# Patient Record
Sex: Female | Born: 1950 | Race: White | Hispanic: No | Smoking: Never smoker
Health system: Southern US, Community
[De-identification: ages and names within clinical notes are randomized; demographics above are authoritative.]

## PROBLEM LIST (undated history)

## (undated) DIAGNOSIS — T884XXA Failed or difficult intubation, initial encounter: Secondary | ICD-10-CM

## (undated) DIAGNOSIS — E785 Hyperlipidemia, unspecified: Secondary | ICD-10-CM

## (undated) DIAGNOSIS — M62838 Other muscle spasm: Secondary | ICD-10-CM

## (undated) DIAGNOSIS — M2550 Pain in unspecified joint: Secondary | ICD-10-CM

## (undated) DIAGNOSIS — M2540 Effusion, unspecified joint: Secondary | ICD-10-CM

## (undated) DIAGNOSIS — T8859XA Other complications of anesthesia, initial encounter: Secondary | ICD-10-CM

## (undated) DIAGNOSIS — E119 Type 2 diabetes mellitus without complications: Secondary | ICD-10-CM

## (undated) DIAGNOSIS — F32A Depression, unspecified: Secondary | ICD-10-CM

## (undated) DIAGNOSIS — C801 Malignant (primary) neoplasm, unspecified: Secondary | ICD-10-CM

## (undated) DIAGNOSIS — Z8709 Personal history of other diseases of the respiratory system: Secondary | ICD-10-CM

## (undated) DIAGNOSIS — R350 Frequency of micturition: Secondary | ICD-10-CM

## (undated) DIAGNOSIS — M1990 Unspecified osteoarthritis, unspecified site: Secondary | ICD-10-CM

## (undated) DIAGNOSIS — T4145XA Adverse effect of unspecified anesthetic, initial encounter: Secondary | ICD-10-CM

## (undated) DIAGNOSIS — T7840XA Allergy, unspecified, initial encounter: Secondary | ICD-10-CM

## (undated) DIAGNOSIS — K5790 Diverticulosis of intestine, part unspecified, without perforation or abscess without bleeding: Secondary | ICD-10-CM

## (undated) DIAGNOSIS — J189 Pneumonia, unspecified organism: Secondary | ICD-10-CM

## (undated) DIAGNOSIS — I10 Essential (primary) hypertension: Secondary | ICD-10-CM

## (undated) DIAGNOSIS — F329 Major depressive disorder, single episode, unspecified: Secondary | ICD-10-CM

## (undated) HISTORY — PX: COLONOSCOPY: SHX174

## (undated) HISTORY — PX: OTHER SURGICAL HISTORY: SHX169

## (undated) HISTORY — PX: CERVICAL CONE BIOPSY: SUR198

## (undated) HISTORY — PX: MYRINGOTOMY: SUR874

---

## 2005-05-19 ENCOUNTER — Ambulatory Visit (HOSPITAL_COMMUNITY): Admission: RE | Admit: 2005-05-19 | Discharge: 2005-05-19 | Payer: Self-pay | Admitting: *Deleted

## 2005-06-05 ENCOUNTER — Ambulatory Visit (HOSPITAL_COMMUNITY): Admission: RE | Admit: 2005-06-05 | Discharge: 2005-06-05 | Payer: Self-pay | Admitting: *Deleted

## 2005-06-05 ENCOUNTER — Encounter (INDEPENDENT_AMBULATORY_CARE_PROVIDER_SITE_OTHER): Payer: Self-pay | Admitting: *Deleted

## 2005-10-02 ENCOUNTER — Encounter: Admission: RE | Admit: 2005-10-02 | Discharge: 2005-10-02 | Payer: Self-pay | Admitting: *Deleted

## 2006-11-04 ENCOUNTER — Encounter: Admission: RE | Admit: 2006-11-04 | Discharge: 2006-11-04 | Payer: Self-pay | Admitting: Internal Medicine

## 2007-07-26 IMAGING — CR DG CHEST 2V
2 series · 2 of 2 positions shown · non-contrast
Comparison: None.

CLINICAL DATA: GYN adenocarcinoma.  Pre-op respiratory exam.
 CHEST - 2 VIEW:

[view not recorded (1 of 2)]
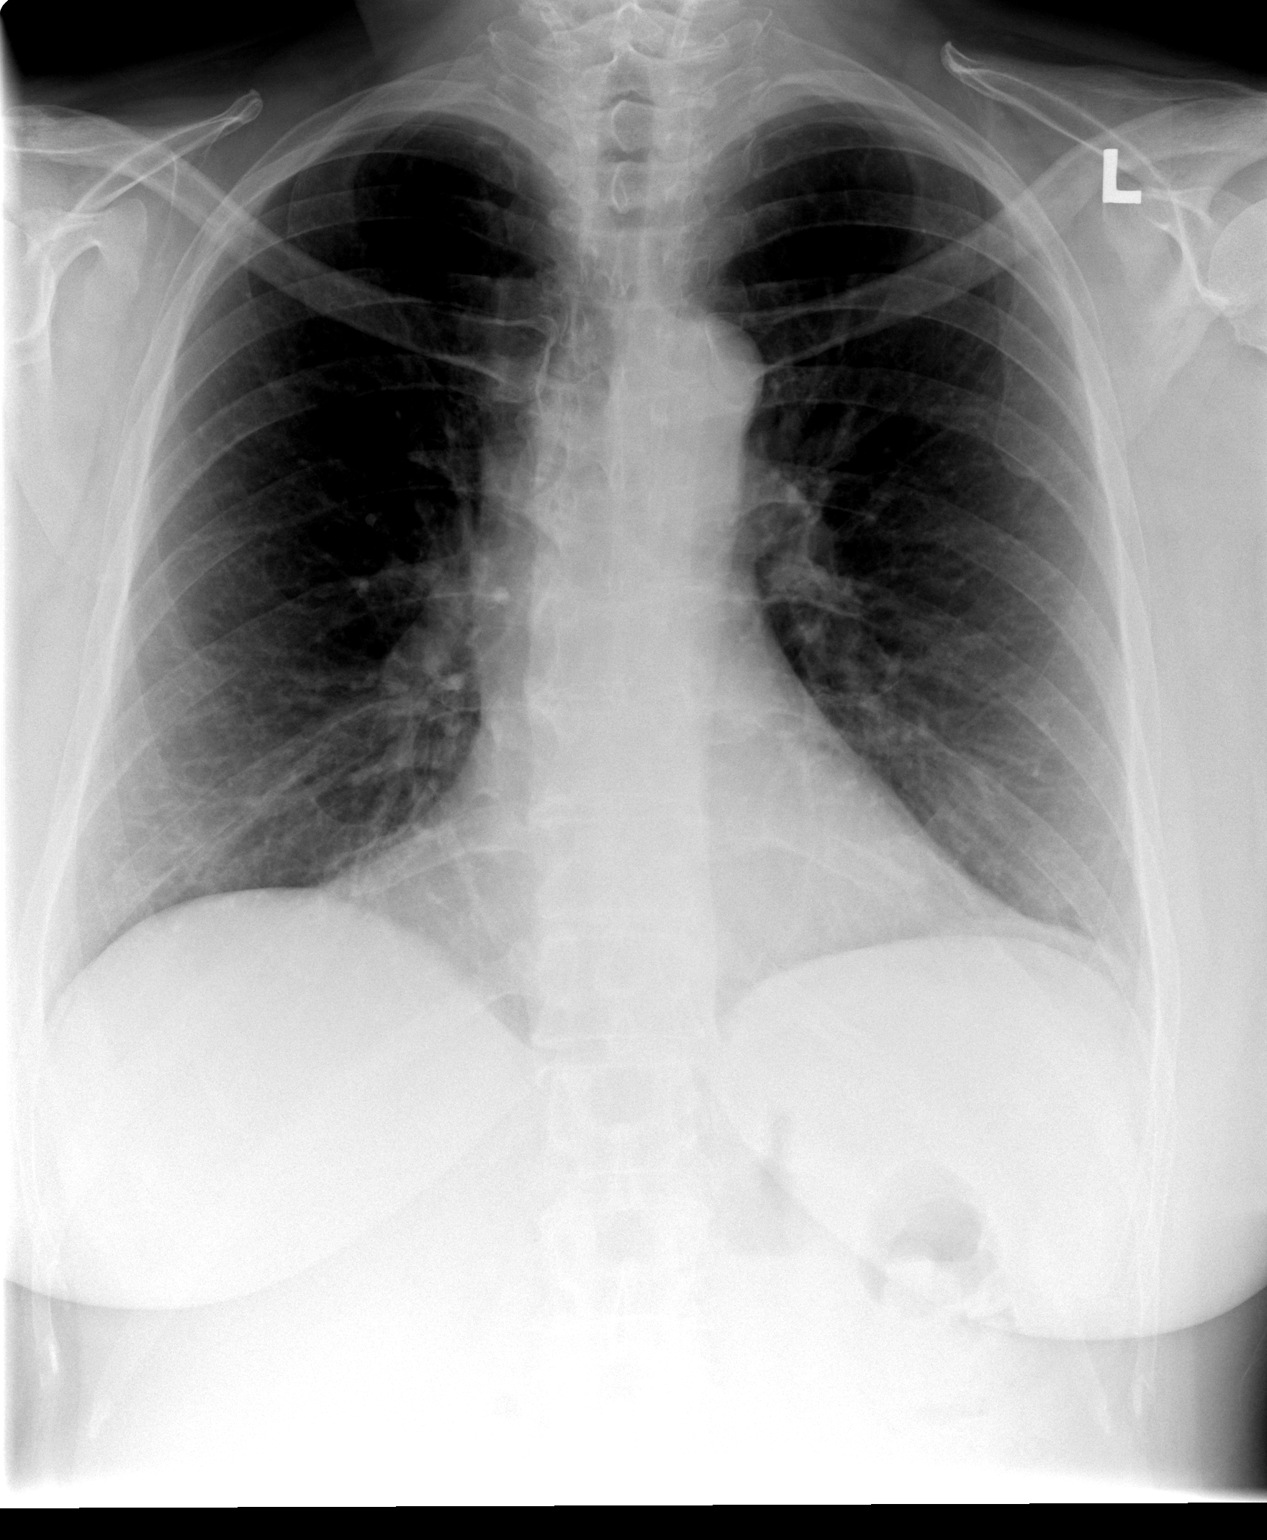

[view not recorded (2 of 2)]
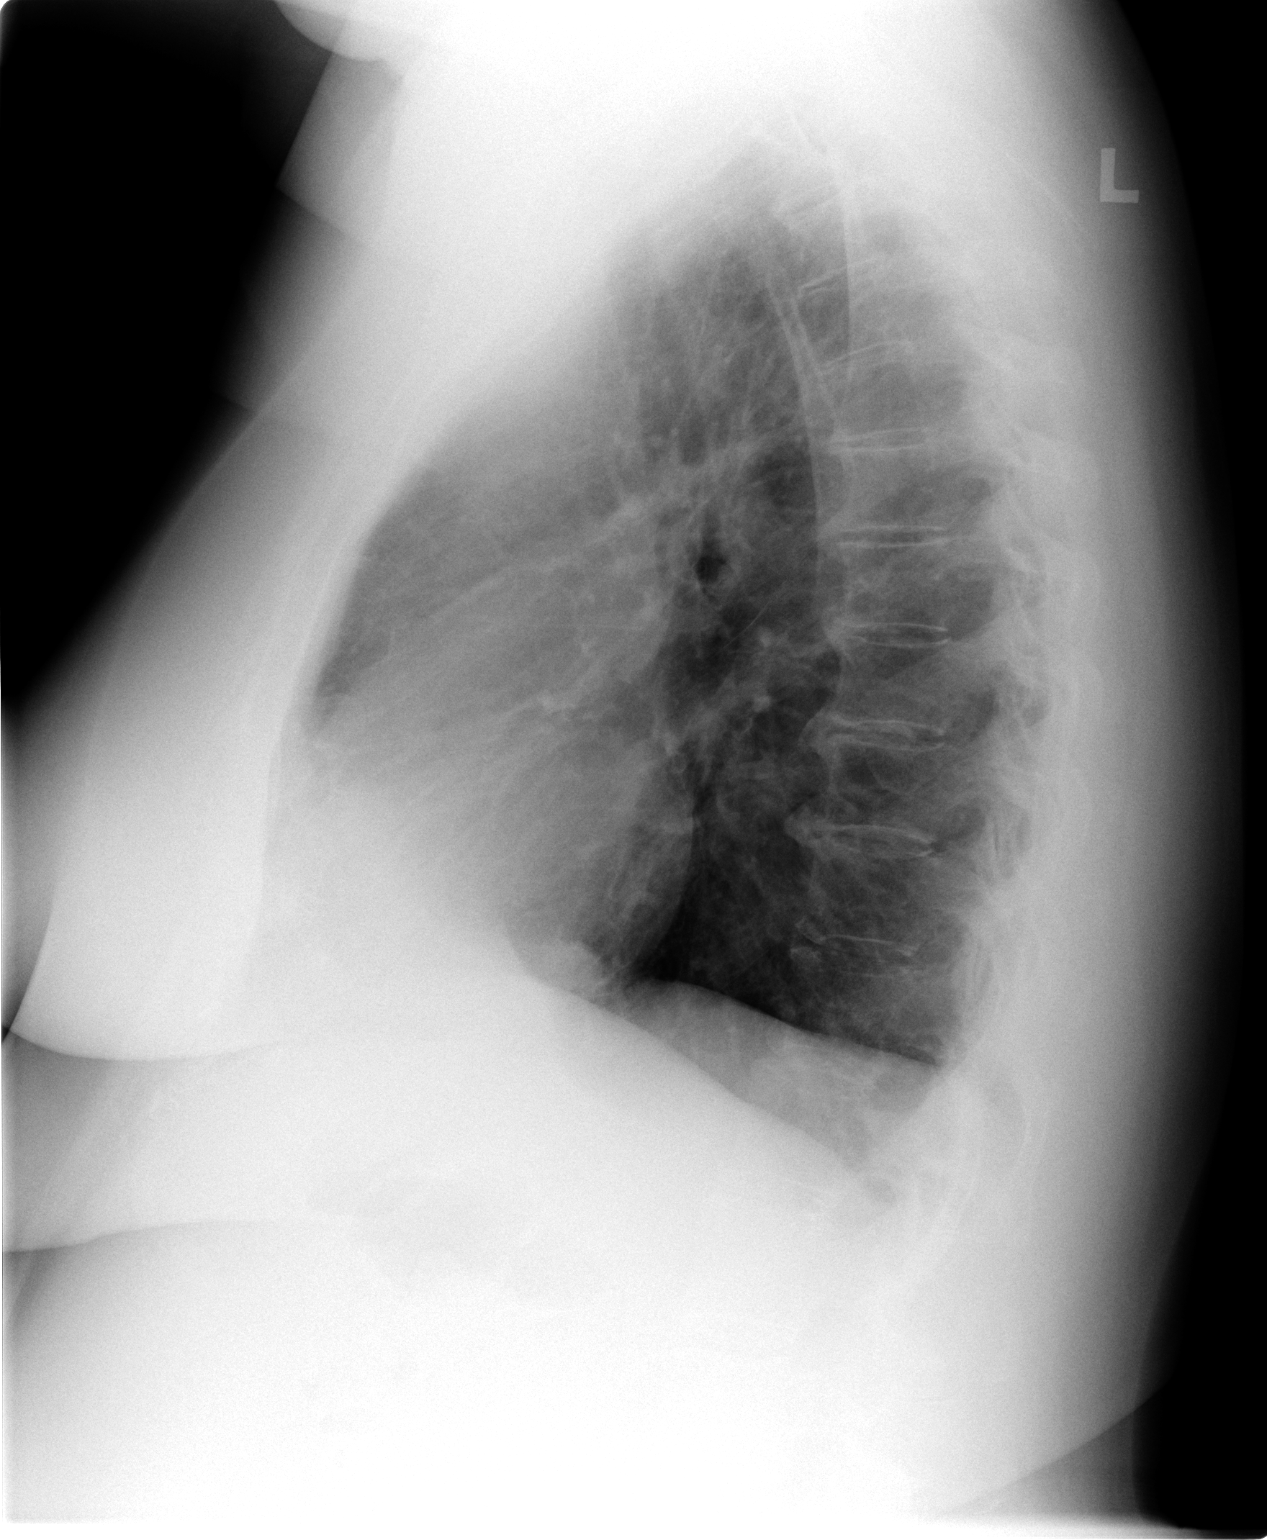

[2 of 2 positions shown; findings below may reference images not displayed]

FINDINGS: Heart size is normal.  Both lungs are clear.  There is no evidence of pleural effusion.  No mass or adenopathy identified.  Prominent epicardial fat pad is noted.  Mild thoracic spine degenerative changes are seen.
IMPRESSION: No active cardiopulmonary disease.

## 2007-11-10 ENCOUNTER — Encounter: Admission: RE | Admit: 2007-11-10 | Discharge: 2007-11-10 | Payer: Self-pay | Admitting: Internal Medicine

## 2008-11-16 ENCOUNTER — Encounter: Admission: RE | Admit: 2008-11-16 | Discharge: 2008-11-16 | Payer: Self-pay | Admitting: Internal Medicine

## 2010-01-16 ENCOUNTER — Encounter
Admission: RE | Admit: 2010-01-16 | Discharge: 2010-01-16 | Payer: Self-pay | Source: Home / Self Care | Attending: Internal Medicine | Admitting: Internal Medicine

## 2010-06-13 NOTE — Op Note (Signed)
NAMEYALISSA, FINK              ACCOUNT NO.:  [id number]   MEDICAL RECORD NO.:  [id number]          PATIENT TYPE:  AMB   LOCATION:  SDC                           FACILITY:  [facility]   PHYSICIAN:  Pershing Cox, M.D.DATE OF BIRTH:  [birth year]   DATE OF PROCEDURE:  06/05/2005  DATE OF DISCHARGE:                                 OPERATIVE REPORT   PREOPERATIVE DIAGNOSIS:  Adenocarcinoma of the cervix.   POSTOPERATIVE DIAGNOSES:  1.  Adenocarcinoma of the cervix.  2.  Vaginal atrophy.   PROCEDURES:  1.  Examination under anesthesia.  2.  Cold knife cone biopsy.   SURGEON:  Pershing Cox, M.D.   ASSISTANT:  None.   ANESTHESIA:  General by LMA and Vasopressin paracervical block.   SPECIMENS:  1.  Cone biopsy of the cervix  2.  Endocervical curettage.   INDICATION FOR PROCEDURE:  The patient is 60 years of age.  She recently had  establishment of new care in our office and had a Pap smear performed, which  was abnormal.  This was read as glandular cell atypia, and her _HC2 assay  was positive.  Colposcopy was performed with no evidence of exocervical  disease.  Endocervical curettage showed adenocarcinoma in situ, and the  patient was counseled for a cone biopsy.  At her original preop exam she was  found to have a history of diabetes and had been off her medications.  Her  blood glucose was over 300.  For that reason we postponed her surgery and  she was seen by a new physician, Dr. Sherryll Burger, who has been managing her  diabetes, and her fingersticks have been under good control.  She is brought  to the operating room today for a cold knife conization to rule out invasive  adenocarcinoma of the endocervix.   OPERATIVE FINDINGS:  The patient had significant vaginal atrophy.  The  cervix was actually very small and all of the vaginal mucosa was constricted  due to the significant atrophy.  The cervix was 4 cm in length on sounding  of the endocervix and the endometrial canal  sounded to 8 cm in depth.   PROCEDURE:  Erykah Lippert was brought to the operating room with an IV in  place.  She received vancomycin as a preoperative antibiotic and thigh-high  PAS stockings were placed on the lower extremities.  She was placed supine  on the OR table and IV sedation was administered.  A cobra LMA was placed in  order to obtain general anesthesia.  She was then placed into Allen stirrups  and prepped with Betadine for a sterile vaginal procedure.  Sterile linens  were used to drape her for this procedure.  Bivalve speculum was inserted  into the vagina and anterior cervix was grasped with a single-tooth  tenaculum.  Using the Vasopressin solution, 20 units in 100 mL, 25 mL were  injected into the paracervical tissues to establish hemostasis.  Vicryl 0  sutures were placed along the exocervix, first superficially and then very  deep.  These were to control descending branches of the uterine  artery  during conization.  Because no lesion had ever been seen on the exocervix  with colposcopy, it was not necessary to use Lugol's or to use acetic acid  staining.  With the stitches using to retract the cervix, a #11 blade was  used to do the initial circle around the exocervix.  This approached the  stitch but did not cross the stitches.  Using Metzenbaum scissors, the  cervical tissue was dissected down into the tip of the cone and the cone was  removed from its base.  Endocervical curettings were then obtained onto a  Telfa and the cone was placed into saline to be taken to the pathologist  after the procedure.   Initially cautery set on 60 units was used to obtain hemostasis.  I used a  ball to make this happen.  I observed after the cautery had been used and  there were still areas of small oozing.  At this point Monsel's solution was  placed deep in the cone and along the exocervix.  Despite this the patient  continued to have a steady ooze from several sites.  A  decision was made to  place Sturmdorf sutures, and these were placed anterior and posteriorly.  Surgicel was inserted into the cone before tying the Sturmdorf sutures.  These were placed with 0 Vicryl absorbable.  These sutures were tied down  and the cervix was observed, and there was no evidence of continued  bleeding.  The patient was taken to the recovery room in good condition.      Pershing Cox, M.D.  Electronically Signed     MAJ/MEDQ  D:  06/05/2005  T:  06/06/2005  Job:  [id number]   cc:   Kirstie Peri, MD  Fax: [phone]

## 2010-09-22 ENCOUNTER — Other Ambulatory Visit: Payer: Self-pay | Admitting: Obstetrics & Gynecology

## 2010-09-22 DIAGNOSIS — Z1231 Encounter for screening mammogram for malignant neoplasm of breast: Secondary | ICD-10-CM

## 2010-11-03 ENCOUNTER — Ambulatory Visit
Admission: RE | Admit: 2010-11-03 | Discharge: 2010-11-03 | Disposition: A | Discharge status: Home / Self Care | Payer: Self-pay | Source: Ambulatory Visit | Attending: Obstetrics & Gynecology | Admitting: Obstetrics & Gynecology

## 2010-11-03 DIAGNOSIS — Z1231 Encounter for screening mammogram for malignant neoplasm of breast: Secondary | ICD-10-CM

## 2011-10-08 ENCOUNTER — Other Ambulatory Visit: Payer: Self-pay | Admitting: Internal Medicine

## 2011-10-08 DIAGNOSIS — Z1231 Encounter for screening mammogram for malignant neoplasm of breast: Secondary | ICD-10-CM

## 2011-11-09 ENCOUNTER — Ambulatory Visit
Admission: RE | Admit: 2011-11-09 | Discharge: 2011-11-09 | Disposition: A | Discharge status: Home / Self Care | Payer: Self-pay | Source: Ambulatory Visit | Attending: Internal Medicine | Admitting: Internal Medicine

## 2011-11-09 DIAGNOSIS — Z1231 Encounter for screening mammogram for malignant neoplasm of breast: Secondary | ICD-10-CM

## 2012-10-04 ENCOUNTER — Other Ambulatory Visit: Payer: Self-pay

## 2012-10-04 DIAGNOSIS — Z1231 Encounter for screening mammogram for malignant neoplasm of breast: Secondary | ICD-10-CM

## 2012-11-09 ENCOUNTER — Ambulatory Visit
Admission: RE | Admit: 2012-11-09 | Discharge: 2012-11-09 | Disposition: A | Discharge status: Home / Self Care | Payer: Self-pay | Source: Ambulatory Visit

## 2012-11-09 DIAGNOSIS — Z1231 Encounter for screening mammogram for malignant neoplasm of breast: Secondary | ICD-10-CM

## 2013-10-03 ENCOUNTER — Other Ambulatory Visit: Payer: Self-pay

## 2013-10-03 DIAGNOSIS — Z1231 Encounter for screening mammogram for malignant neoplasm of breast: Secondary | ICD-10-CM

## 2013-10-31 ENCOUNTER — Ambulatory Visit: Payer: Self-pay

## 2013-11-01 ENCOUNTER — Ambulatory Visit
Admission: RE | Admit: 2013-11-01 | Discharge: 2013-11-01 | Disposition: A | Discharge status: Home / Self Care | Payer: No Typology Code available for payment source | Source: Ambulatory Visit

## 2013-11-01 DIAGNOSIS — Z1231 Encounter for screening mammogram for malignant neoplasm of breast: Secondary | ICD-10-CM

## 2013-12-01 ENCOUNTER — Ambulatory Visit (HOSPITAL_COMMUNITY)
Admission: RE | Admit: 2013-12-01 | Discharge: 2013-12-01 | Disposition: A | Discharge status: Home / Self Care | Payer: Self-pay | Source: Ambulatory Visit | Attending: Surgery | Admitting: Surgery

## 2013-12-01 ENCOUNTER — Other Ambulatory Visit (HOSPITAL_COMMUNITY): Payer: Self-pay | Admitting: Internal Medicine

## 2013-12-01 DIAGNOSIS — H349 Unspecified retinal vascular occlusion: Secondary | ICD-10-CM

## 2014-01-26 DIAGNOSIS — Z8709 Personal history of other diseases of the respiratory system: Secondary | ICD-10-CM

## 2014-01-26 HISTORY — DX: Personal history of other diseases of the respiratory system: Z87.09

## 2014-06-27 DIAGNOSIS — Z0000 Encounter for general adult medical examination without abnormal findings: Secondary | ICD-10-CM | POA: Diagnosis not present

## 2014-06-27 DIAGNOSIS — I10 Essential (primary) hypertension: Secondary | ICD-10-CM | POA: Diagnosis not present

## 2014-06-27 DIAGNOSIS — E1139 Type 2 diabetes mellitus with other diabetic ophthalmic complication: Secondary | ICD-10-CM | POA: Diagnosis not present

## 2014-08-28 DIAGNOSIS — H35371 Puckering of macula, right eye: Secondary | ICD-10-CM | POA: Diagnosis not present

## 2014-08-28 DIAGNOSIS — E11339 Type 2 diabetes mellitus with moderate nonproliferative diabetic retinopathy without macular edema: Secondary | ICD-10-CM | POA: Diagnosis not present

## 2014-10-02 ENCOUNTER — Other Ambulatory Visit: Payer: Self-pay

## 2014-10-02 DIAGNOSIS — Z1231 Encounter for screening mammogram for malignant neoplasm of breast: Secondary | ICD-10-CM

## 2014-11-20 ENCOUNTER — Ambulatory Visit: Payer: Self-pay

## 2014-11-22 ENCOUNTER — Ambulatory Visit: Payer: Self-pay

## 2014-12-12 DIAGNOSIS — R32 Unspecified urinary incontinence: Secondary | ICD-10-CM | POA: Diagnosis not present

## 2014-12-12 DIAGNOSIS — J302 Other seasonal allergic rhinitis: Secondary | ICD-10-CM | POA: Diagnosis not present

## 2014-12-12 DIAGNOSIS — E785 Hyperlipidemia, unspecified: Secondary | ICD-10-CM | POA: Diagnosis not present

## 2014-12-12 DIAGNOSIS — Z23 Encounter for immunization: Secondary | ICD-10-CM | POA: Diagnosis not present

## 2014-12-12 DIAGNOSIS — E669 Obesity, unspecified: Secondary | ICD-10-CM | POA: Diagnosis not present

## 2014-12-12 DIAGNOSIS — F329 Major depressive disorder, single episode, unspecified: Secondary | ICD-10-CM | POA: Diagnosis not present

## 2014-12-12 DIAGNOSIS — E11319 Type 2 diabetes mellitus with unspecified diabetic retinopathy without macular edema: Secondary | ICD-10-CM | POA: Diagnosis not present

## 2014-12-12 DIAGNOSIS — Z6833 Body mass index (BMI) 33.0-33.9, adult: Secondary | ICD-10-CM | POA: Diagnosis not present

## 2014-12-12 DIAGNOSIS — Z0000 Encounter for general adult medical examination without abnormal findings: Secondary | ICD-10-CM | POA: Diagnosis not present

## 2014-12-12 DIAGNOSIS — Z1389 Encounter for screening for other disorder: Secondary | ICD-10-CM | POA: Diagnosis not present

## 2014-12-12 DIAGNOSIS — E1139 Type 2 diabetes mellitus with other diabetic ophthalmic complication: Secondary | ICD-10-CM | POA: Diagnosis not present

## 2014-12-12 DIAGNOSIS — I10 Essential (primary) hypertension: Secondary | ICD-10-CM | POA: Diagnosis not present

## 2014-12-28 ENCOUNTER — Ambulatory Visit
Admission: RE | Admit: 2014-12-28 | Discharge: 2014-12-28 | Disposition: A | Discharge status: Home / Self Care | Payer: Medicare Other | Source: Ambulatory Visit

## 2014-12-28 DIAGNOSIS — Z1231 Encounter for screening mammogram for malignant neoplasm of breast: Secondary | ICD-10-CM | POA: Diagnosis not present

## 2015-04-15 DIAGNOSIS — E784 Other hyperlipidemia: Secondary | ICD-10-CM | POA: Diagnosis not present

## 2015-04-15 DIAGNOSIS — Z1389 Encounter for screening for other disorder: Secondary | ICD-10-CM | POA: Diagnosis not present

## 2015-04-15 DIAGNOSIS — Z6833 Body mass index (BMI) 33.0-33.9, adult: Secondary | ICD-10-CM | POA: Diagnosis not present

## 2015-04-15 DIAGNOSIS — E1139 Type 2 diabetes mellitus with other diabetic ophthalmic complication: Secondary | ICD-10-CM | POA: Diagnosis not present

## 2015-04-15 DIAGNOSIS — I10 Essential (primary) hypertension: Secondary | ICD-10-CM | POA: Diagnosis not present

## 2015-04-25 DIAGNOSIS — E113293 Type 2 diabetes mellitus with mild nonproliferative diabetic retinopathy without macular edema, bilateral: Secondary | ICD-10-CM | POA: Diagnosis not present

## 2015-06-10 DIAGNOSIS — H5203 Hypermetropia, bilateral: Secondary | ICD-10-CM | POA: Diagnosis not present

## 2015-06-10 DIAGNOSIS — H2513 Age-related nuclear cataract, bilateral: Secondary | ICD-10-CM | POA: Diagnosis not present

## 2015-06-10 DIAGNOSIS — E113293 Type 2 diabetes mellitus with mild nonproliferative diabetic retinopathy without macular edema, bilateral: Secondary | ICD-10-CM | POA: Diagnosis not present

## 2015-09-16 DIAGNOSIS — E784 Other hyperlipidemia: Secondary | ICD-10-CM | POA: Diagnosis not present

## 2015-09-16 DIAGNOSIS — F329 Major depressive disorder, single episode, unspecified: Secondary | ICD-10-CM | POA: Diagnosis not present

## 2015-09-16 DIAGNOSIS — J302 Other seasonal allergic rhinitis: Secondary | ICD-10-CM | POA: Diagnosis not present

## 2015-09-16 DIAGNOSIS — E669 Obesity, unspecified: Secondary | ICD-10-CM | POA: Diagnosis not present

## 2015-09-16 DIAGNOSIS — Z1389 Encounter for screening for other disorder: Secondary | ICD-10-CM | POA: Diagnosis not present

## 2015-09-16 DIAGNOSIS — I10 Essential (primary) hypertension: Secondary | ICD-10-CM | POA: Diagnosis not present

## 2015-09-16 DIAGNOSIS — E11319 Type 2 diabetes mellitus with unspecified diabetic retinopathy without macular edema: Secondary | ICD-10-CM | POA: Diagnosis not present

## 2015-09-16 DIAGNOSIS — E1139 Type 2 diabetes mellitus with other diabetic ophthalmic complication: Secondary | ICD-10-CM | POA: Diagnosis not present

## 2015-09-16 DIAGNOSIS — Z6832 Body mass index (BMI) 32.0-32.9, adult: Secondary | ICD-10-CM | POA: Diagnosis not present

## 2015-11-04 DIAGNOSIS — Z6832 Body mass index (BMI) 32.0-32.9, adult: Secondary | ICD-10-CM | POA: Diagnosis not present

## 2015-11-04 DIAGNOSIS — M25511 Pain in right shoulder: Secondary | ICD-10-CM | POA: Diagnosis not present

## 2015-11-04 DIAGNOSIS — W101XXA Fall (on)(from) sidewalk curb, initial encounter: Secondary | ICD-10-CM | POA: Diagnosis not present

## 2015-11-27 DIAGNOSIS — M7501 Adhesive capsulitis of right shoulder: Secondary | ICD-10-CM | POA: Diagnosis not present

## 2015-11-27 DIAGNOSIS — R531 Weakness: Secondary | ICD-10-CM | POA: Diagnosis not present

## 2015-11-27 DIAGNOSIS — M25512 Pain in left shoulder: Secondary | ICD-10-CM | POA: Diagnosis not present

## 2015-11-27 DIAGNOSIS — M25511 Pain in right shoulder: Secondary | ICD-10-CM | POA: Diagnosis not present

## 2015-12-04 DIAGNOSIS — N390 Urinary tract infection, site not specified: Secondary | ICD-10-CM | POA: Diagnosis not present

## 2015-12-04 DIAGNOSIS — E1139 Type 2 diabetes mellitus with other diabetic ophthalmic complication: Secondary | ICD-10-CM | POA: Diagnosis not present

## 2015-12-04 DIAGNOSIS — I10 Essential (primary) hypertension: Secondary | ICD-10-CM | POA: Diagnosis not present

## 2015-12-04 DIAGNOSIS — R8299 Other abnormal findings in urine: Secondary | ICD-10-CM | POA: Diagnosis not present

## 2015-12-04 DIAGNOSIS — E784 Other hyperlipidemia: Secondary | ICD-10-CM | POA: Diagnosis not present

## 2015-12-09 DIAGNOSIS — I10 Essential (primary) hypertension: Secondary | ICD-10-CM | POA: Diagnosis not present

## 2015-12-09 DIAGNOSIS — M25511 Pain in right shoulder: Secondary | ICD-10-CM | POA: Diagnosis not present

## 2015-12-09 DIAGNOSIS — E1139 Type 2 diabetes mellitus with other diabetic ophthalmic complication: Secondary | ICD-10-CM | POA: Diagnosis not present

## 2015-12-09 DIAGNOSIS — F329 Major depressive disorder, single episode, unspecified: Secondary | ICD-10-CM | POA: Diagnosis not present

## 2015-12-09 DIAGNOSIS — E669 Obesity, unspecified: Secondary | ICD-10-CM | POA: Diagnosis not present

## 2015-12-09 DIAGNOSIS — Z6832 Body mass index (BMI) 32.0-32.9, adult: Secondary | ICD-10-CM | POA: Diagnosis not present

## 2015-12-09 DIAGNOSIS — E784 Other hyperlipidemia: Secondary | ICD-10-CM | POA: Diagnosis not present

## 2015-12-09 DIAGNOSIS — E11319 Type 2 diabetes mellitus with unspecified diabetic retinopathy without macular edema: Secondary | ICD-10-CM | POA: Diagnosis not present

## 2015-12-13 DIAGNOSIS — R531 Weakness: Secondary | ICD-10-CM | POA: Diagnosis not present

## 2015-12-13 DIAGNOSIS — M25511 Pain in right shoulder: Secondary | ICD-10-CM | POA: Diagnosis not present

## 2015-12-13 DIAGNOSIS — M7501 Adhesive capsulitis of right shoulder: Secondary | ICD-10-CM | POA: Diagnosis not present

## 2015-12-13 DIAGNOSIS — M25512 Pain in left shoulder: Secondary | ICD-10-CM | POA: Diagnosis not present

## 2015-12-18 DIAGNOSIS — M25511 Pain in right shoulder: Secondary | ICD-10-CM | POA: Diagnosis not present

## 2015-12-18 DIAGNOSIS — R531 Weakness: Secondary | ICD-10-CM | POA: Diagnosis not present

## 2015-12-18 DIAGNOSIS — M7501 Adhesive capsulitis of right shoulder: Secondary | ICD-10-CM | POA: Diagnosis not present

## 2015-12-18 DIAGNOSIS — M25512 Pain in left shoulder: Secondary | ICD-10-CM | POA: Diagnosis not present

## 2015-12-24 DIAGNOSIS — R531 Weakness: Secondary | ICD-10-CM | POA: Diagnosis not present

## 2015-12-24 DIAGNOSIS — M25512 Pain in left shoulder: Secondary | ICD-10-CM | POA: Diagnosis not present

## 2015-12-24 DIAGNOSIS — M7501 Adhesive capsulitis of right shoulder: Secondary | ICD-10-CM | POA: Diagnosis not present

## 2015-12-24 DIAGNOSIS — M25511 Pain in right shoulder: Secondary | ICD-10-CM | POA: Diagnosis not present

## 2016-01-02 DIAGNOSIS — M25511 Pain in right shoulder: Secondary | ICD-10-CM | POA: Diagnosis not present

## 2016-01-02 DIAGNOSIS — M7501 Adhesive capsulitis of right shoulder: Secondary | ICD-10-CM | POA: Diagnosis not present

## 2016-01-02 DIAGNOSIS — R531 Weakness: Secondary | ICD-10-CM | POA: Diagnosis not present

## 2016-01-02 DIAGNOSIS — M25512 Pain in left shoulder: Secondary | ICD-10-CM | POA: Diagnosis not present

## 2016-01-13 DIAGNOSIS — R531 Weakness: Secondary | ICD-10-CM | POA: Diagnosis not present

## 2016-01-13 DIAGNOSIS — M25511 Pain in right shoulder: Secondary | ICD-10-CM | POA: Diagnosis not present

## 2016-01-13 DIAGNOSIS — M25512 Pain in left shoulder: Secondary | ICD-10-CM | POA: Diagnosis not present

## 2016-01-13 DIAGNOSIS — M7501 Adhesive capsulitis of right shoulder: Secondary | ICD-10-CM | POA: Diagnosis not present

## 2016-01-22 DIAGNOSIS — M7501 Adhesive capsulitis of right shoulder: Secondary | ICD-10-CM | POA: Diagnosis not present

## 2016-01-22 DIAGNOSIS — M25511 Pain in right shoulder: Secondary | ICD-10-CM | POA: Diagnosis not present

## 2016-01-22 DIAGNOSIS — R531 Weakness: Secondary | ICD-10-CM | POA: Diagnosis not present

## 2016-01-22 DIAGNOSIS — M25512 Pain in left shoulder: Secondary | ICD-10-CM | POA: Diagnosis not present

## 2016-03-19 ENCOUNTER — Other Ambulatory Visit: Payer: Self-pay | Admitting: Internal Medicine

## 2016-03-19 DIAGNOSIS — Z1231 Encounter for screening mammogram for malignant neoplasm of breast: Secondary | ICD-10-CM

## 2016-04-08 DIAGNOSIS — H35371 Puckering of macula, right eye: Secondary | ICD-10-CM | POA: Diagnosis not present

## 2016-04-08 DIAGNOSIS — E113293 Type 2 diabetes mellitus with mild nonproliferative diabetic retinopathy without macular edema, bilateral: Secondary | ICD-10-CM | POA: Diagnosis not present

## 2016-04-09 DIAGNOSIS — Z6832 Body mass index (BMI) 32.0-32.9, adult: Secondary | ICD-10-CM | POA: Diagnosis not present

## 2016-04-09 DIAGNOSIS — M25511 Pain in right shoulder: Secondary | ICD-10-CM | POA: Diagnosis not present

## 2016-04-09 DIAGNOSIS — E1139 Type 2 diabetes mellitus with other diabetic ophthalmic complication: Secondary | ICD-10-CM | POA: Diagnosis not present

## 2016-04-09 DIAGNOSIS — E11319 Type 2 diabetes mellitus with unspecified diabetic retinopathy without macular edema: Secondary | ICD-10-CM | POA: Diagnosis not present

## 2016-04-09 DIAGNOSIS — Z1389 Encounter for screening for other disorder: Secondary | ICD-10-CM | POA: Diagnosis not present

## 2016-04-09 DIAGNOSIS — F3289 Other specified depressive episodes: Secondary | ICD-10-CM | POA: Diagnosis not present

## 2016-04-09 DIAGNOSIS — I10 Essential (primary) hypertension: Secondary | ICD-10-CM | POA: Diagnosis not present

## 2016-04-09 DIAGNOSIS — E668 Other obesity: Secondary | ICD-10-CM | POA: Diagnosis not present

## 2016-04-09 DIAGNOSIS — J302 Other seasonal allergic rhinitis: Secondary | ICD-10-CM | POA: Diagnosis not present

## 2016-04-09 DIAGNOSIS — E784 Other hyperlipidemia: Secondary | ICD-10-CM | POA: Diagnosis not present

## 2016-04-15 DIAGNOSIS — Z86010 Personal history of colonic polyps: Secondary | ICD-10-CM | POA: Diagnosis not present

## 2016-04-15 DIAGNOSIS — K5730 Diverticulosis of large intestine without perforation or abscess without bleeding: Secondary | ICD-10-CM | POA: Diagnosis not present

## 2016-04-16 ENCOUNTER — Ambulatory Visit
Admission: RE | Admit: 2016-04-16 | Discharge: 2016-04-16 | Disposition: A | Discharge status: Home / Self Care | Payer: Medicare Other | Source: Ambulatory Visit | Attending: Internal Medicine | Admitting: Internal Medicine

## 2016-04-16 DIAGNOSIS — Z1231 Encounter for screening mammogram for malignant neoplasm of breast: Secondary | ICD-10-CM

## 2016-04-21 DIAGNOSIS — S4981XS Other specified injuries of right shoulder and upper arm, sequela: Secondary | ICD-10-CM | POA: Diagnosis not present

## 2016-05-06 DIAGNOSIS — S4981XS Other specified injuries of right shoulder and upper arm, sequela: Secondary | ICD-10-CM | POA: Diagnosis not present

## 2016-05-07 DIAGNOSIS — S4981XS Other specified injuries of right shoulder and upper arm, sequela: Secondary | ICD-10-CM | POA: Diagnosis not present

## 2016-06-02 NOTE — H&P (Signed)
  Olivia Holmes is an 66 y.o. female.    Chief Complaint: right shoulder pain  HPI: Pt is a 66 y.o. female complaining of right shoulder pain for multiple years. Pain had continually increased since the beginning. X-rays in the clinic show end-stage arthritic changes of the right shoulder. Pt has tried various conservative treatments which have failed to alleviate their symptoms, including injections and therapy. Various options are discussed with the patient. Risks, benefits and expectations were discussed with the patient. Patient understand the risks, benefits and expectations and wishes to proceed with surgery.   PCP:  Marton Redwood, MD  D/C Plans: Home  PMH: No past medical history on file.  PSH: No past surgical history on file.  Social History:  has no tobacco, alcohol, and drug history on file.  Allergies:  Allergies not on file  Medications: No current facility-administered medications for this encounter.    No current outpatient prescriptions on file.    No results found for this or any previous visit (from the past 48 hour(s)). No results found.  ROS: Pain with rom of the right upper extremity  Physical Exam:  Alert and oriented 66 y.o. female in no acute distress Cranial nerves 2-12 intact Cervical spine: full rom with no tenderness, nv intact distally Chest: active breath sounds bilaterally, no wheeze rhonchi or rales Heart: regular rate and rhythm, no murmur Abd: non tender non distended with active bowel sounds Hip is stable with rom  Right shoulder with limited rom and weakness due to arthropathy nv intact distally No rashes or edema  Assessment/Plan Assessment: right shoulder cuff arthropathy  Plan: Patient will undergo a right reverse total shoulder by Dr. Veverly Fells at [facility]. Risks benefits and expectations were discussed with the patient. Patient understand risks, benefits and expectations and wishes to proceed.

## 2016-06-03 NOTE — Pre-Procedure Instructions (Signed)
Olivia Holmes  06/03/2016      [facility] [address] [place], [place] [address] Phone: [phone] Fax: [phone]    Your procedure is scheduled on Fri, May 18 @ 7:30 AM  Report to [facility] Admitting at 5:30 AM  Call this number if you have problems the morning of surgery:  [phone]   Remember:  Do not eat food or drink liquids after midnight.  Take these medicines the morning of surgery with A SIP OF WATER Albuterol<Bring Your Inhaler With You>,Flonase(Fluticasone),and Zoloft(Sertraline)              Stop taking your Aspirin. No Goody's,BC's,Aleve,Advil,Motrin,Ibuprofen,Fish Oil,or any Herbal Medications.      How to Manage Your Diabetes Before and After Surgery  Why is it important to control my blood sugar before and after surgery? . Improving blood sugar levels before and after surgery helps healing and can limit problems. . A way of improving blood sugar control is eating a healthy diet by: o  Eating less sugar and carbohydrates o  Increasing activity/exercise o  Talking with your doctor about reaching your blood sugar goals . High blood sugars (greater than 180 mg/dL) can raise your risk of infections and slow your recovery, so you will need to focus on controlling your diabetes during the weeks before surgery. . Make sure that the doctor who takes care of your diabetes knows about your planned surgery including the date and location.  How do I manage my blood sugar before surgery? . Check your blood sugar at least 4 times a day, starting 2 days before surgery, to make sure that the level is not too high or low. o Check your blood sugar the morning of your surgery when you wake up and every 2 hours until you get to the Short Stay unit. . If your blood sugar is less than 70 mg/dL, you will need to treat for low blood sugar: o Do not take insulin. o Treat a low blood sugar  (less than 70 mg/dL) with  cup of clear juice (cranberry or apple), 4 glucose tablets, OR glucose gel. o Recheck blood sugar in 15 minutes after treatment (to make sure it is greater than 70 mg/dL). If your blood sugar is not greater than 70 mg/dL on recheck, call [phone] for further instructions. . Report your blood sugar to the short stay nurse when you get to Short Stay.  . If you are admitted to the hospital after surgery: o Your blood sugar will be checked by the staff and you will probably be given insulin after surgery (instead of oral diabetes medicines) to make sure you have good blood sugar levels. o The goal for blood sugar control after surgery is 80-180 mg/dL.              WHAT DO I DO ABOUT MY DIABETES MEDICATION?   Marland Kitchen Do not take oral diabetes medicines (pills) the morning of surgery.   Reviewed and Endorsed by [facility] Patient Education Committee, August 2015   Do not wear jewelry, make-up or nail polish.  Do not wear lotions, powders,perfumes, or deoderant.  Do not shave 48 hours prior to surgery.    Do not bring valuables to the hospital.  [facility] is not responsible for any belongings or valuables.  Contacts, dentures or bridgework may not be worn into surgery.  Leave your suitcase in the car.  After surgery it may be brought to your room.  For patients admitted to the hospital, discharge time will be determined by your treatment team.  Patients discharged the day of surgery will not be allowed to drive home.   [facility]  Before surgery, you can play an important role.  Because skin is not sterile, your skin needs to be as free of germs as possible.  You can reduce the number of germs on you skin by washing with CHG (chlorahexidine gluconate) soap before surgery.  CHG is an antiseptic cleaner which kills germs and bonds with the skin to continue killing germs even after washing.  Please DO NOT use if you have  an allergy to CHG or antibacterial soaps.  If your skin becomes reddened/irritated stop using the CHG and inform your nurse when you arrive at Short Stay.  Do not shave (including legs and underarms) for at least 48 hours prior to the first CHG shower.  You may shave your face.  Please follow these instructions carefully:   1.  Shower with CHG Soap the night before surgery and the                                morning of Surgery.  2.  If you choose to wash your hair, wash your hair first as usual with your       normal shampoo.  3.  After you shampoo, rinse your hair and body thoroughly to remove the                      Shampoo.  4.  Use CHG as you would any other liquid soap.  You can apply chg directly       to the skin and wash gently with scrungie or a clean washcloth.  5.  Apply the CHG Soap to your body ONLY FROM THE NECK DOWN.        Do not use on open wounds or open sores.  Avoid contact with your eyes,       ears, mouth and genitals (private parts).  Wash genitals (private parts)       with your normal soap.  6.  Wash thoroughly, paying special attention to the area where your surgery        will be performed.  7.  Thoroughly rinse your body with warm water from the neck down.  8.  DO NOT shower/wash with your normal soap after using and rinsing off       the CHG Soap.  9.  Pat yourself dry with a clean towel.            10.  Wear clean pajamas.            11.  Place clean sheets on your bed the night of your first shower and do not        sleep with pets.  Day of Surgery  Do not apply any lotions/deoderants the morning of surgery.  Please wear clean clothes to the hospital/surgery center.    Please read over the following fact sheets that you were given. Pain Booklet, Coughing and Deep Breathing, MRSA Information and Surgical Site Infection Prevention

## 2016-06-04 ENCOUNTER — Encounter (HOSPITAL_COMMUNITY): Payer: Self-pay

## 2016-06-04 ENCOUNTER — Encounter (HOSPITAL_COMMUNITY)
Admission: RE | Admit: 2016-06-04 | Discharge: 2016-06-04 | Disposition: A | Discharge status: Home / Self Care | Payer: Medicare Other | Source: Ambulatory Visit | Attending: Orthopedic Surgery | Admitting: Orthopedic Surgery

## 2016-06-04 DIAGNOSIS — I10 Essential (primary) hypertension: Secondary | ICD-10-CM | POA: Diagnosis not present

## 2016-06-04 DIAGNOSIS — Z90710 Acquired absence of both cervix and uterus: Secondary | ICD-10-CM | POA: Insufficient documentation

## 2016-06-04 DIAGNOSIS — E119 Type 2 diabetes mellitus without complications: Secondary | ICD-10-CM | POA: Diagnosis not present

## 2016-06-04 DIAGNOSIS — Z8541 Personal history of malignant neoplasm of cervix uteri: Secondary | ICD-10-CM | POA: Insufficient documentation

## 2016-06-04 DIAGNOSIS — M12811 Other specific arthropathies, not elsewhere classified, right shoulder: Secondary | ICD-10-CM | POA: Insufficient documentation

## 2016-06-04 DIAGNOSIS — Z01812 Encounter for preprocedural laboratory examination: Secondary | ICD-10-CM | POA: Insufficient documentation

## 2016-06-04 HISTORY — DX: Failed or difficult intubation, initial encounter: T88.4XXA

## 2016-06-04 HISTORY — DX: Adverse effect of unspecified anesthetic, initial encounter: T41.45XA

## 2016-06-04 HISTORY — DX: Unspecified osteoarthritis, unspecified site: M19.90

## 2016-06-04 HISTORY — DX: Diverticulosis of intestine, part unspecified, without perforation or abscess without bleeding: K57.90

## 2016-06-04 HISTORY — DX: Other complications of anesthesia, initial encounter: T88.59XA

## 2016-06-04 HISTORY — DX: Frequency of micturition: R35.0

## 2016-06-04 HISTORY — DX: Pain in unspecified joint: M25.50

## 2016-06-04 HISTORY — DX: Pneumonia, unspecified organism: J18.9

## 2016-06-04 HISTORY — DX: Allergy, unspecified, initial encounter: T78.40XA

## 2016-06-04 HISTORY — DX: Essential (primary) hypertension: I10

## 2016-06-04 HISTORY — DX: Hyperlipidemia, unspecified: E78.5

## 2016-06-04 HISTORY — DX: Personal history of other diseases of the respiratory system: Z87.09

## 2016-06-04 HISTORY — DX: Malignant (primary) neoplasm, unspecified: C80.1

## 2016-06-04 HISTORY — DX: Effusion, unspecified joint: M25.40

## 2016-06-04 HISTORY — DX: Depression, unspecified: F32.A

## 2016-06-04 HISTORY — DX: Type 2 diabetes mellitus without complications: E11.9

## 2016-06-04 HISTORY — DX: Other muscle spasm: M62.838

## 2016-06-04 HISTORY — DX: Major depressive disorder, single episode, unspecified: F32.9

## 2016-06-04 LAB — CBC
HCT: 38.3 % (ref 36.0–46.0)
Hemoglobin: 12.8 g/dL (ref 12.0–15.0)
MCH: 30.2 pg (ref 26.0–34.0)
MCHC: 33.4 g/dL (ref 30.0–36.0)
MCV: 90.3 fL (ref 78.0–100.0)
Platelets: 223 10*3/uL (ref 150–400)
RBC: 4.24 MIL/uL (ref 3.87–5.11)
RDW: 12.6 % (ref 11.5–15.5)
WBC: 8.7 10*3/uL (ref 4.0–10.5)

## 2016-06-04 LAB — BASIC METABOLIC PANEL
Anion gap: 10 (ref 5–15)
BUN: 15 mg/dL (ref 6–20)
CO2: 25 mmol/L (ref 22–32)
Calcium: 9.5 mg/dL (ref 8.9–10.3)
Chloride: 103 mmol/L (ref 101–111)
Creatinine, Ser: 0.72 mg/dL (ref 0.44–1.00)
GFR calc Af Amer: >60 mL/min (ref >60)
GFR calc non Af Amer: >60 mL/min (ref >60)
Glucose, Bld: 161 mg/dL — ABNORMAL HIGH (ref 65–99)
Potassium: 4.3 mmol/L (ref 3.5–5.1)
Sodium: 138 mmol/L (ref 135–145)

## 2016-06-04 LAB — SURGICAL PCR SCREEN
MRSA, PCR: NEGATIVE
Staphylococcus aureus: NEGATIVE

## 2016-06-04 LAB — GLUCOSE, CAPILLARY: Glucose-Capillary: 144 mg/dL — ABNORMAL HIGH (ref 65–99)

## 2016-06-04 MED ORDER — CHLORHEXIDINE GLUCONATE 4 % EX LIQD: 60.0000 mL | Freq: Once | CUTANEOUS | Status: DC

## 2016-06-04 NOTE — Progress Notes (Addendum)
Cardiologist denies  Medical Md is Dr.William Red Christians denies  Stress test denies  Heart cath denies  EKG denies in past yr  CXR denies in past yr

## 2016-06-04 NOTE — Progress Notes (Addendum)
Anesthesia Note: Patient is a 66 year old female scheduled for right reverse shoulder arthroplasty on 06/12/16 by Dr. Veverly Fells. Anesthesia is posted for general with interscalene block.  History includes never smoker, HLD, HTN, DM2, cervical cancer s/p cone biopsy '07 s/p radical robotic hysterectomy '07 [facility]), diverticulosis. BMI is consistent with obesity.  For anesthesia history she reports DIFFICULT INTUBATION in 2007 (with hysterectomy [facility] '07) and that she is hard to wake up (with cone biopsy [facility] '07). Following hysterectomy, she had bruising on the right side of her mouth and there throat was very sore.    PCP is Dr. Marton Redwood who signed a not for medical clearance. She was discharged from oncology after 5 years.   Meds include albuterol, aspirin 81 mg (on hold), Lipitor, Flexeril, Flonase, glipizide, lisinopril, metformin, Zoloft.  BP (!) 143/48   Pulse 75   Temp 36.5 C   Resp 20   Ht 5' 9.25" (1.759 m)   Wt 220 lb (99.8 kg)   SpO2 95%   BMI 32.25 kg/m   She is a pleasant Caucasian female in NAD. Heart RRR, no murmur. Lungs clear. Mallampati II. Neck mobility okay. Denied any lose teeth.   EKG 06/04/16: NSR, non-specific ST abnormality. She denied any known cardiac history. Denied chest pain orSOB.   Carotid U/S 12/01/13: Impression: 1. 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 2. Bilateral vertebral artery flor is antegrade.  Preoperative labs noted. A1c in process. Glucose (fasting) 161. Cr 0.72. CBC WNL. She does not check her CBGs at home. Her last two A1c's have been 7.9 and 7.1.   Anesthesia records requested from [facility]. I'll plan to update note regarding difficult airway history if records received. Her anesthesiologist will also evaluate on the day of surgery and will determine definitive anesthesia plan at that time. (Update 06/11/16 2:50 PM: I re-requested 2007 anesthesia records from [facility] on 06/09/16, but still have not received any additional  records.)  George Hugh [facility] Short Stay Center/Anesthesiology Phone [phone] 06/04/2016 4:37 PM

## 2016-06-05 LAB — HEMOGLOBIN A1C
Hgb A1c MFr Bld: 7 % — ABNORMAL HIGH (ref 4.8–5.6)
Mean Plasma Glucose: 154 mg/dL

## 2016-06-11 MED ORDER — VANCOMYCIN HCL 10 G IV SOLR
1500.0000 mg | INTRAVENOUS | Status: AC
  Administered 2016-06-12: 1500 mg via INTRAVENOUS
  Filled 2016-06-11: qty 1500

## 2016-06-11 NOTE — Anesthesia Preprocedure Evaluation (Addendum)
Anesthesia Evaluation  Patient identified by MRN, date of birth, ID band Patient awake    Reviewed: Allergy & Precautions, H&P , NPO status , Patient's Chart, lab work & pertinent test results  History of Anesthesia Complications (+) DIFFICULT AIRWAY  Airway Mallampati: II  TM Distance: <3 FB Neck ROM: Full    Dental no notable dental hx. (+) Teeth Intact, Dental Advisory Given   Pulmonary neg pulmonary ROS,    Pulmonary exam normal breath sounds clear to auscultation       Cardiovascular Exercise Tolerance: Good hypertension, Pt. on medications  Rhythm:Regular Rate:Normal     Neuro/Psych Depression negative neurological ROS  negative psych ROS   GI/Hepatic negative GI ROS, Neg liver ROS,   Endo/Other  diabetes, Type 2, Oral Hypoglycemic Agents  Renal/GU negative Renal ROS  negative genitourinary   Musculoskeletal  (+) Arthritis , Osteoarthritis,    Abdominal   Peds  Hematology negative hematology ROS (+)   Anesthesia Other Findings   Reproductive/Obstetrics negative OB ROS                            Anesthesia Physical Anesthesia Plan  ASA: II  Anesthesia Plan: General   Post-op Pain Management:  Regional for Post-op pain   Induction: Intravenous  Airway Management Planned: Oral ETT  Additional Equipment:   Intra-op Plan:   Post-operative Plan: Extubation in OR  Informed Consent: I have reviewed the patients History and Physical, chart, labs and discussed the procedure including the risks, benefits and alternatives for the proposed anesthesia with the patient or authorized representative who has indicated his/her understanding and acceptance.   Dental advisory given  Plan Discussed with: CRNA  Anesthesia Plan Comments:         Anesthesia Quick Evaluation

## 2016-06-12 ENCOUNTER — Inpatient Hospital Stay (HOSPITAL_COMMUNITY): Payer: Medicare Other | Admitting: Vascular Surgery

## 2016-06-12 ENCOUNTER — Inpatient Hospital Stay (HOSPITAL_COMMUNITY)
Admission: RE | Admit: 2016-06-12 | Discharge: 2016-06-15 | DRG: 483 | Disposition: A | Discharge status: Skilled Nursing Facility | Payer: Medicare Other | Source: Ambulatory Visit | Attending: Orthopedic Surgery | Admitting: Orthopedic Surgery

## 2016-06-12 ENCOUNTER — Inpatient Hospital Stay (HOSPITAL_COMMUNITY): Payer: Medicare Other

## 2016-06-12 ENCOUNTER — Encounter (HOSPITAL_COMMUNITY): Payer: Self-pay | Admitting: Urology

## 2016-06-12 ENCOUNTER — Encounter (HOSPITAL_COMMUNITY)
Admission: RE | Disposition: A | Discharge status: Skilled Nursing Facility | Payer: Self-pay | Source: Ambulatory Visit | Attending: Orthopedic Surgery

## 2016-06-12 ENCOUNTER — Inpatient Hospital Stay (HOSPITAL_COMMUNITY): Payer: Medicare Other | Admitting: Certified Registered"

## 2016-06-12 DIAGNOSIS — I10 Essential (primary) hypertension: Secondary | ICD-10-CM | POA: Diagnosis not present

## 2016-06-12 DIAGNOSIS — G8911 Acute pain due to trauma: Secondary | ICD-10-CM | POA: Diagnosis not present

## 2016-06-12 DIAGNOSIS — M19011 Primary osteoarthritis, right shoulder: Secondary | ICD-10-CM | POA: Diagnosis not present

## 2016-06-12 DIAGNOSIS — S4990XA Unspecified injury of shoulder and upper arm, unspecified arm, initial encounter: Secondary | ICD-10-CM | POA: Diagnosis not present

## 2016-06-12 DIAGNOSIS — G8918 Other acute postprocedural pain: Secondary | ICD-10-CM | POA: Diagnosis not present

## 2016-06-12 DIAGNOSIS — J9589 Other postprocedural complications and disorders of respiratory system, not elsewhere classified: Secondary | ICD-10-CM

## 2016-06-12 DIAGNOSIS — Z96611 Presence of right artificial shoulder joint: Secondary | ICD-10-CM | POA: Diagnosis not present

## 2016-06-12 DIAGNOSIS — R41841 Cognitive communication deficit: Secondary | ICD-10-CM | POA: Diagnosis not present

## 2016-06-12 DIAGNOSIS — Z7984 Long term (current) use of oral hypoglycemic drugs: Secondary | ICD-10-CM

## 2016-06-12 DIAGNOSIS — J40 Bronchitis, not specified as acute or chronic: Secondary | ICD-10-CM | POA: Diagnosis not present

## 2016-06-12 DIAGNOSIS — R278 Other lack of coordination: Secondary | ICD-10-CM | POA: Diagnosis not present

## 2016-06-12 DIAGNOSIS — R05 Cough: Secondary | ICD-10-CM | POA: Diagnosis not present

## 2016-06-12 DIAGNOSIS — M75101 Unspecified rotator cuff tear or rupture of right shoulder, not specified as traumatic: Secondary | ICD-10-CM | POA: Diagnosis not present

## 2016-06-12 DIAGNOSIS — M25511 Pain in right shoulder: Secondary | ICD-10-CM | POA: Diagnosis present

## 2016-06-12 DIAGNOSIS — E119 Type 2 diabetes mellitus without complications: Secondary | ICD-10-CM | POA: Diagnosis present

## 2016-06-12 DIAGNOSIS — M62838 Other muscle spasm: Secondary | ICD-10-CM | POA: Diagnosis not present

## 2016-06-12 DIAGNOSIS — R0602 Shortness of breath: Secondary | ICD-10-CM | POA: Diagnosis not present

## 2016-06-12 DIAGNOSIS — R2681 Unsteadiness on feet: Secondary | ICD-10-CM | POA: Diagnosis not present

## 2016-06-12 DIAGNOSIS — Z471 Aftercare following joint replacement surgery: Secondary | ICD-10-CM | POA: Diagnosis not present

## 2016-06-12 HISTORY — PX: REVERSE SHOULDER ARTHROPLASTY: SHX5054

## 2016-06-12 LAB — GLUCOSE, CAPILLARY
Glucose-Capillary: 211 mg/dL — ABNORMAL HIGH (ref 65–99)
Glucose-Capillary: 193 mg/dL — ABNORMAL HIGH (ref 65–99)
Glucose-Capillary: 202 mg/dL — ABNORMAL HIGH (ref 65–99)
Glucose-Capillary: 238 mg/dL — ABNORMAL HIGH (ref 65–99)
Glucose-Capillary: 117 mg/dL — ABNORMAL HIGH (ref 65–99)

## 2016-06-12 SURGERY — ARTHROPLASTY, SHOULDER, TOTAL, REVERSE
Anesthesia: General | Laterality: Right

## 2016-06-12 MED ORDER — SODIUM CHLORIDE 0.9 % IR SOLN
Status: DC | PRN
  Administered 2016-06-12: 3000 mL

## 2016-06-12 MED ORDER — CYCLOBENZAPRINE HCL 10 MG PO TABS
10.0000 mg | ORAL_TABLET | Freq: Every day | ORAL | Status: DC | PRN
  Administered 2016-06-15: 10 mg via ORAL
  Filled 2016-06-12: qty 1

## 2016-06-12 MED ORDER — INSULIN ASPART 100 UNIT/ML ~~LOC~~ SOLN
0.0000 [IU] | Freq: Three times a day (TID) | SUBCUTANEOUS | Status: DC
Start: 2016-06-12 — End: 2016-06-15
  Administered 2016-06-12 – 2016-06-13 (×2): 7 [IU] via SUBCUTANEOUS
  Administered 2016-06-14: 4 [IU] via SUBCUTANEOUS
  Administered 2016-06-14 – 2016-06-15 (×2): 3 [IU] via SUBCUTANEOUS

## 2016-06-12 MED ORDER — SERTRALINE HCL 50 MG PO TABS
50.0000 mg | ORAL_TABLET | Freq: Every day | ORAL | Status: DC
  Administered 2016-06-14 – 2016-06-15 (×2): 50 mg via ORAL
  Filled 2016-06-12 (×2): qty 1

## 2016-06-12 MED ORDER — POLYETHYLENE GLYCOL 3350 17 G PO PACK
17.0000 g | PACK | Freq: Every day | ORAL | Status: DC | PRN
  Administered 2016-06-15: 17 g via ORAL
  Filled 2016-06-12: qty 1

## 2016-06-12 MED ORDER — FENTANYL CITRATE (PF) 250 MCG/5ML IJ SOLN
INTRAMUSCULAR | Status: AC
  Filled 2016-06-12: qty 5

## 2016-06-12 MED ORDER — ONDANSETRON HCL 4 MG/2ML IJ SOLN: 4.0000 mg | Freq: Four times a day (QID) | INTRAMUSCULAR | Status: DC | PRN

## 2016-06-12 MED ORDER — DEXTROSE 5 % IV SOLN
500.0000 mg | Freq: Four times a day (QID) | INTRAVENOUS | Status: DC | PRN
  Filled 2016-06-12: qty 5

## 2016-06-12 MED ORDER — SUCCINYLCHOLINE CHLORIDE 200 MG/10ML IV SOSY
PREFILLED_SYRINGE | INTRAVENOUS | Status: DC | PRN
  Administered 2016-06-12: 110 mg via INTRAVENOUS

## 2016-06-12 MED ORDER — ONDANSETRON HCL 4 MG/2ML IJ SOLN
INTRAMUSCULAR | Status: DC | PRN
  Administered 2016-06-12: 4 mg via INTRAVENOUS

## 2016-06-12 MED ORDER — BSS IO SOLN
INTRAOCULAR | Status: AC
  Filled 2016-06-12: qty 15

## 2016-06-12 MED ORDER — OXYCODONE HCL 5 MG PO TABS
5.0000 mg | ORAL_TABLET | ORAL | Status: DC | PRN
  Administered 2016-06-12 (×2): 5 mg via ORAL
  Administered 2016-06-13 – 2016-06-15 (×15): 10 mg via ORAL
  Filled 2016-06-12 (×6): qty 2
  Filled 2016-06-12: qty 1
  Filled 2016-06-12 (×10): qty 2

## 2016-06-12 MED ORDER — METHOCARBAMOL 500 MG PO TABS
500.0000 mg | ORAL_TABLET | Freq: Four times a day (QID) | ORAL | Status: DC | PRN
  Administered 2016-06-12 – 2016-06-15 (×10): 500 mg via ORAL
  Filled 2016-06-12 (×10): qty 1

## 2016-06-12 MED ORDER — MORPHINE SULFATE (PF) 4 MG/ML IV SOLN: 2.0000 mg | INTRAVENOUS | Status: DC | PRN

## 2016-06-12 MED ORDER — BUPIVACAINE-EPINEPHRINE (PF) 0.5% -1:200000 IJ SOLN: INTRAMUSCULAR | Status: DC | PRN

## 2016-06-12 MED ORDER — ACETAMINOPHEN 650 MG RE SUPP: 650.0000 mg | Freq: Four times a day (QID) | RECTAL | Status: DC | PRN

## 2016-06-12 MED ORDER — BUPIVACAINE-EPINEPHRINE (PF) 0.5% -1:200000 IJ SOLN
INTRAMUSCULAR | Status: DC | PRN
  Administered 2016-06-12: 30 mL via PERINEURAL

## 2016-06-12 MED ORDER — PROPOFOL 10 MG/ML IV BOLUS
INTRAVENOUS | Status: AC
  Filled 2016-06-12: qty 20

## 2016-06-12 MED ORDER — METHOCARBAMOL 500 MG PO TABS: 500.0000 mg | ORAL_TABLET | Freq: Three times a day (TID) | ORAL | 1 refills | Status: AC | PRN

## 2016-06-12 MED ORDER — LIDOCAINE 2% (20 MG/ML) 5 ML SYRINGE
INTRAMUSCULAR | Status: DC | PRN
  Administered 2016-06-12: 60 mg via INTRAVENOUS

## 2016-06-12 MED ORDER — PHENOL 1.4 % MT LIQD: 1.0000 | OROMUCOSAL | Status: DC | PRN

## 2016-06-12 MED ORDER — ONDANSETRON HCL 4 MG PO TABS: 4.0000 mg | ORAL_TABLET | Freq: Four times a day (QID) | ORAL | Status: DC | PRN

## 2016-06-12 MED ORDER — INSULIN ASPART 100 UNIT/ML ~~LOC~~ SOLN
6.0000 [IU] | Freq: Three times a day (TID) | SUBCUTANEOUS | Status: DC
  Administered 2016-06-12 – 2016-06-15 (×4): 6 [IU] via SUBCUTANEOUS

## 2016-06-12 MED ORDER — OXYCODONE-ACETAMINOPHEN 5-325 MG PO TABS: 1.0000 | ORAL_TABLET | ORAL | 0 refills | Status: DC | PRN

## 2016-06-12 MED ORDER — ALBUTEROL SULFATE (2.5 MG/3ML) 0.083% IN NEBU: 2.5000 mg | INHALATION_SOLUTION | Freq: Four times a day (QID) | RESPIRATORY_TRACT | Status: DC | PRN

## 2016-06-12 MED ORDER — DEXAMETHASONE SODIUM PHOSPHATE 10 MG/ML IJ SOLN
INTRAMUSCULAR | Status: DC | PRN
  Administered 2016-06-12: 5 mg via INTRAVENOUS

## 2016-06-12 MED ORDER — PHENYLEPHRINE HCL 10 MG/ML IJ SOLN
INTRAVENOUS | Status: DC | PRN
  Administered 2016-06-12: 40 ug/min via INTRAVENOUS

## 2016-06-12 MED ORDER — DEXAMETHASONE SODIUM PHOSPHATE 10 MG/ML IJ SOLN
INTRAMUSCULAR | Status: AC
  Filled 2016-06-12: qty 1

## 2016-06-12 MED ORDER — METOCLOPRAMIDE HCL 5 MG PO TABS
5.0000 mg | ORAL_TABLET | Freq: Three times a day (TID) | ORAL | Status: DC | PRN
  Administered 2016-06-13: 10 mg via ORAL
  Filled 2016-06-12: qty 2

## 2016-06-12 MED ORDER — LISINOPRIL 10 MG PO TABS
10.0000 mg | ORAL_TABLET | Freq: Every day | ORAL | Status: DC
  Administered 2016-06-14 – 2016-06-15 (×2): 10 mg via ORAL
  Filled 2016-06-12 (×2): qty 1

## 2016-06-12 MED ORDER — MIDAZOLAM HCL 2 MG/2ML IJ SOLN
INTRAMUSCULAR | Status: AC
  Filled 2016-06-12: qty 2

## 2016-06-12 MED ORDER — GLIPIZIDE 5 MG PO TABS
5.0000 mg | ORAL_TABLET | Freq: Every evening | ORAL | Status: DC
  Administered 2016-06-12 – 2016-06-15 (×4): 5 mg via ORAL
  Filled 2016-06-12 (×5): qty 1

## 2016-06-12 MED ORDER — EPINEPHRINE PF 1 MG/ML IJ SOLN
INTRAMUSCULAR | Status: AC
  Filled 2016-06-12: qty 1

## 2016-06-12 MED ORDER — BUPIVACAINE-EPINEPHRINE 0.25% -1:200000 IJ SOLN
INTRAMUSCULAR | Status: DC | PRN
  Administered 2016-06-12: 8 mL

## 2016-06-12 MED ORDER — FENTANYL CITRATE (PF) 100 MCG/2ML IJ SOLN
INTRAMUSCULAR | Status: DC | PRN
  Administered 2016-06-12: 50 ug via INTRAVENOUS
  Administered 2016-06-12 (×2): 75 ug via INTRAVENOUS

## 2016-06-12 MED ORDER — METOCLOPRAMIDE HCL 5 MG/ML IJ SOLN: 5.0000 mg | Freq: Three times a day (TID) | INTRAMUSCULAR | Status: DC | PRN

## 2016-06-12 MED ORDER — METFORMIN HCL 500 MG PO TABS
1000.0000 mg | ORAL_TABLET | Freq: Two times a day (BID) | ORAL | Status: DC
Start: 2016-06-12 — End: 2016-06-15
  Administered 2016-06-12 – 2016-06-15 (×6): 1000 mg via ORAL
  Filled 2016-06-12 (×6): qty 2

## 2016-06-12 MED ORDER — GLYCOPYRROLATE 0.2 MG/ML IJ SOLN
INTRAMUSCULAR | Status: DC | PRN
  Administered 2016-06-12: 0.2 mg via INTRAVENOUS

## 2016-06-12 MED ORDER — VANCOMYCIN HCL IN DEXTROSE 1-5 GM/200ML-% IV SOLN
1000.0000 mg | Freq: Two times a day (BID) | INTRAVENOUS | Status: AC
  Administered 2016-06-12: 1000 mg via INTRAVENOUS
  Filled 2016-06-12: qty 200

## 2016-06-12 MED ORDER — DOCUSATE SODIUM 100 MG PO CAPS
100.0000 mg | ORAL_CAPSULE | Freq: Two times a day (BID) | ORAL | Status: DC
  Administered 2016-06-12 – 2016-06-15 (×6): 100 mg via ORAL
  Filled 2016-06-12 (×6): qty 1

## 2016-06-12 MED ORDER — MIDAZOLAM HCL 5 MG/5ML IJ SOLN
INTRAMUSCULAR | Status: DC | PRN
  Administered 2016-06-12: 2 mg via INTRAVENOUS

## 2016-06-12 MED ORDER — BUPIVACAINE HCL (PF) 0.25 % IJ SOLN
INTRAMUSCULAR | Status: AC
  Filled 2016-06-12: qty 30

## 2016-06-12 MED ORDER — FLUTICASONE PROPIONATE 50 MCG/ACT NA SUSP: 1.0000 | Freq: Every day | NASAL | Status: DC | PRN

## 2016-06-12 MED ORDER — SODIUM CHLORIDE 0.9 % IV SOLN: INTRAVENOUS | Status: DC

## 2016-06-12 MED ORDER — ATORVASTATIN CALCIUM 20 MG PO TABS
20.0000 mg | ORAL_TABLET | Freq: Every day | ORAL | Status: DC
  Administered 2016-06-12 – 2016-06-15 (×4): 20 mg via ORAL
  Filled 2016-06-12 (×4): qty 1

## 2016-06-12 MED ORDER — HYDROMORPHONE HCL 1 MG/ML IJ SOLN: 0.2500 mg | INTRAMUSCULAR | Status: DC | PRN

## 2016-06-12 MED ORDER — LACTATED RINGERS IV SOLN
INTRAVENOUS | Status: DC | PRN
  Administered 2016-06-12 (×2): via INTRAVENOUS

## 2016-06-12 MED ORDER — 0.9 % SODIUM CHLORIDE (POUR BTL) OPTIME
TOPICAL | Status: DC | PRN
  Administered 2016-06-12: 1000 mL

## 2016-06-12 MED ORDER — ONDANSETRON HCL 4 MG/2ML IJ SOLN
INTRAMUSCULAR | Status: AC
  Filled 2016-06-12: qty 2

## 2016-06-12 MED ORDER — SUCCINYLCHOLINE CHLORIDE 200 MG/10ML IV SOSY
PREFILLED_SYRINGE | INTRAVENOUS | Status: AC
  Filled 2016-06-12: qty 10

## 2016-06-12 MED ORDER — PROPOFOL 10 MG/ML IV BOLUS
INTRAVENOUS | Status: DC | PRN
  Administered 2016-06-12: 200 mg via INTRAVENOUS
  Administered 2016-06-12: 40 mg via INTRAVENOUS
  Administered 2016-06-12: 20 mg via INTRAVENOUS

## 2016-06-12 MED ORDER — INSULIN ASPART 100 UNIT/ML ~~LOC~~ SOLN: 0.0000 [IU] | Freq: Every day | SUBCUTANEOUS | Status: DC

## 2016-06-12 MED ORDER — MENTHOL 3 MG MT LOZG: 1.0000 | LOZENGE | OROMUCOSAL | Status: DC | PRN

## 2016-06-12 MED ORDER — ACETAMINOPHEN 325 MG PO TABS
650.0000 mg | ORAL_TABLET | Freq: Four times a day (QID) | ORAL | Status: DC | PRN
  Administered 2016-06-15: 650 mg via ORAL
  Filled 2016-06-12: qty 2

## 2016-06-12 MED ORDER — ASPIRIN EC 81 MG PO TBEC
81.0000 mg | DELAYED_RELEASE_TABLET | Freq: Every evening | ORAL | Status: DC
  Administered 2016-06-12 – 2016-06-15 (×4): 81 mg via ORAL
  Filled 2016-06-12 (×4): qty 1

## 2016-06-12 SURGICAL SUPPLY — 66 items
BIT DRILL 170X2.5X (BIT) ×1 IMPLANT
BIT DRILL 5/64X5 DISP (BIT) ×2 IMPLANT
BIT DRL 170X2.5X (BIT) ×1
BLADE SAG 18X100X1.27 (BLADE) ×2 IMPLANT
CAPT SHLDR REVTOTAL 1 ×2 IMPLANT
CEMENT HV SMART SET (Cement) ×2 IMPLANT
CLSR STERI-STRIP ANTIMIC 1/2X4 (GAUZE/BANDAGES/DRESSINGS) ×2 IMPLANT
COVER SURGICAL LIGHT HANDLE (MISCELLANEOUS) ×2 IMPLANT
DRAPE IMP U-DRAPE 54X76 (DRAPES) ×4 IMPLANT
DRAPE INCISE IOBAN 66X45 STRL (DRAPES) ×2 IMPLANT
DRAPE ORTHO SPLIT 77X108 STRL (DRAPES) ×2
DRAPE SURG ORHT 6 SPLT 77X108 (DRAPES) ×2 IMPLANT
DRAPE U-SHAPE 47X51 STRL (DRAPES) ×2 IMPLANT
DRILL 2.5 (BIT) ×1
DRSG ADAPTIC 3X8 NADH LF (GAUZE/BANDAGES/DRESSINGS) ×2 IMPLANT
DRSG PAD ABDOMINAL 8X10 ST (GAUZE/BANDAGES/DRESSINGS) ×2 IMPLANT
DURAPREP 26ML APPLICATOR (WOUND CARE) ×2 IMPLANT
ELECT BLADE 4.0 EZ CLEAN MEGAD (MISCELLANEOUS) ×2
ELECT NEEDLE TIP 2.8 STRL (NEEDLE) ×2 IMPLANT
ELECT REM PT RETURN 9FT ADLT (ELECTROSURGICAL) ×2
ELECTRODE BLDE 4.0 EZ CLN MEGD (MISCELLANEOUS) ×1 IMPLANT
ELECTRODE REM PT RTRN 9FT ADLT (ELECTROSURGICAL) ×1 IMPLANT
GAUZE SPONGE 4X4 12PLY STRL (GAUZE/BANDAGES/DRESSINGS) ×2 IMPLANT
GAUZE SPONGE 4X4 16PLY XRAY LF (GAUZE/BANDAGES/DRESSINGS) ×2 IMPLANT
GLOVE BIOGEL PI ORTHO PRO 7.5 (GLOVE) ×1
GLOVE BIOGEL PI ORTHO PRO SZ8 (GLOVE) ×1
GLOVE ORTHO TXT STRL SZ7.5 (GLOVE) ×2 IMPLANT
GLOVE PI ORTHO PRO STRL 7.5 (GLOVE) ×1 IMPLANT
GLOVE PI ORTHO PRO STRL SZ8 (GLOVE) ×1 IMPLANT
GLOVE SURG ORTHO 8.5 STRL (GLOVE) ×2 IMPLANT
GOWN STRL REUS W/ TWL LRG LVL3 (GOWN DISPOSABLE) ×1 IMPLANT
GOWN STRL REUS W/ TWL XL LVL3 (GOWN DISPOSABLE) ×2 IMPLANT
GOWN STRL REUS W/TWL LRG LVL3 (GOWN DISPOSABLE) ×1
GOWN STRL REUS W/TWL XL LVL3 (GOWN DISPOSABLE) ×2
KIT BASIN OR (CUSTOM PROCEDURE TRAY) ×2 IMPLANT
KIT ROOM TURNOVER OR (KITS) ×2 IMPLANT
MANIFOLD NEPTUNE II (INSTRUMENTS) ×2 IMPLANT
NEEDLE 1/2 CIR MAYO (NEEDLE) ×2 IMPLANT
NEEDLE HYPO 25GX1X1/2 BEV (NEEDLE) ×2 IMPLANT
NS IRRIG 1000ML POUR BTL (IV SOLUTION) ×2 IMPLANT
PACK SHOULDER (CUSTOM PROCEDURE TRAY) ×2 IMPLANT
PAD ARMBOARD 7.5X6 YLW CONV (MISCELLANEOUS) ×4 IMPLANT
PIN GUIDE 1.2 (PIN) ×2 IMPLANT
PIN GUIDE GLENOPHERE 1.5MX300M (PIN) ×2 IMPLANT
PIN METAGLENE 2.5 (PIN) ×2 IMPLANT
SLING ARM FOAM STRAP LRG (SOFTGOODS) ×2 IMPLANT
SLING ARM FOAM STRAP MED (SOFTGOODS) IMPLANT
SLING ARM IMMOBILIZER LRG (SOFTGOODS) ×2 IMPLANT
SPONGE LAP 18X18 X RAY DECT (DISPOSABLE) ×2 IMPLANT
SPONGE LAP 4X18 X RAY DECT (DISPOSABLE) ×2 IMPLANT
STRIP CLOSURE SKIN 1/2X4 (GAUZE/BANDAGES/DRESSINGS) ×2 IMPLANT
SUCTION FRAZIER HANDLE 10FR (MISCELLANEOUS) ×1
SUCTION TUBE FRAZIER 10FR DISP (MISCELLANEOUS) ×1 IMPLANT
SUT FIBERWIRE #2 38 T-5 BLUE (SUTURE) ×4
SUT MNCRL AB 4-0 PS2 18 (SUTURE) ×2 IMPLANT
SUT VIC AB 0 CT2 27 (SUTURE) ×2 IMPLANT
SUT VIC AB 2-0 CT1 27 (SUTURE) ×1
SUT VIC AB 2-0 CT1 TAPERPNT 27 (SUTURE) ×1 IMPLANT
SUT VICRYL 0 CT 1 36IN (SUTURE) ×2 IMPLANT
SUTURE FIBERWR #2 38 T-5 BLUE (SUTURE) ×2 IMPLANT
SYR CONTROL 10ML LL (SYRINGE) ×2 IMPLANT
TOWEL OR 17X24 6PK STRL BLUE (TOWEL DISPOSABLE) ×2 IMPLANT
TOWEL OR 17X26 10 PK STRL BLUE (TOWEL DISPOSABLE) ×2 IMPLANT
TOWER CARTRIDGE SMART MIX (DISPOSABLE) ×2 IMPLANT
WATER STERILE IRR 1000ML POUR (IV SOLUTION) ×2 IMPLANT
YANKAUER SUCT BULB TIP NO VENT (SUCTIONS) ×2 IMPLANT

## 2016-06-12 NOTE — Anesthesia Procedure Notes (Signed)
Anesthesia Regional Block: Interscalene brachial plexus block   Pre-Anesthetic Checklist: ,, timeout performed, Correct Patient, Correct Site, Correct Laterality, Correct Procedure, Correct Position, site marked, Risks and benefits discussed, pre-op evaluation,  At surgeon's request and post-op pain management  Laterality: Right  Prep: Maximum Sterile Barrier Precautions used, chloraprep       Needles:  Injection technique: Single-shot  Needle Type: Echogenic Stimulator Needle     Needle Length: 5cm  Needle Gauge: 22     Additional Needles:   Procedures: ultrasound guided, nerve stimulator,,,,,,   Nerve Stimulator or Paresthesia:  Response: Biceps response,   Additional Responses:   Narrative:  Start time: 06/12/2016 7:00 AM End time: 06/12/2016 7:10 AM Injection made incrementally with aspirations every 5 mL. Anesthesiologist: Roderic Palau  Additional Notes: 2% Lidocaine skin wheel.

## 2016-06-12 NOTE — Anesthesia Procedure Notes (Signed)
Procedure Name: Intubation Date/Time: 06/12/2016 7:41 AM Performed by: Melina Copa, Nicoya Friel R Pre-anesthesia Checklist: Patient identified, Emergency Drugs available, Suction available and Patient being monitored Patient Re-evaluated:Patient Re-evaluated prior to inductionOxygen Delivery Method: Circle System Utilized Preoxygenation: Pre-oxygenation with 100% oxygen Intubation Type: IV induction Ventilation: Mask ventilation without difficulty Laryngoscope Size: Glidescope Tube type: Oral Tube size: 7.0 mm Number of attempts: 1 Airway Equipment and Method: Stylet Placement Confirmation: ETT inserted through vocal cords under direct vision,  positive ETCO2 and breath sounds checked- equal and bilateral Secured at: 21 cm Tube secured with: Tape Dental Injury: Teeth and Oropharynx as per pre-operative assessment

## 2016-06-12 NOTE — Interval H&P Note (Signed)
History and Physical Interval Note:  06/12/2016 7:27 AM  Olivia Holmes  has presented today for surgery, with the diagnosis of Right shoulder rotator cuff arthropathy  The various methods of treatment have been discussed with the patient and family. After consideration of risks, benefits and other options for treatment, the patient has consented to  Procedure(s): REVERSE SHOULDER ARTHROPLASTY (Right) as a surgical intervention .  The patient's history has been reviewed, patient examined, no change in status, stable for surgery.  I have reviewed the patient's chart and labs.  Questions were answered to the patient's satisfaction.     [name],STEVEN R

## 2016-06-12 NOTE — Brief Op Note (Signed)
06/12/2016  9:56 AM  PATIENT:  Olivia Holmes  66 y.o. female  PRE-OPERATIVE DIAGNOSIS:  Right shoulder rotator cuff arthropathy  POST-OPERATIVE DIAGNOSIS:  Right shoulder rotator cuff arthropathy  PROCEDURE:  Procedure(s): REVERSE SHOULDER ARTHROPLASTY (Right) DePuy Delta Xtend  SURGEON:  Surgeon(s) and Role:    Netta Cedars, MD - Primary  PHYSICIAN ASSISTANT:   ASSISTANTS: Narda Amber PA-C   ANESTHESIA:   regional and general  EBL:  Total I/O In: 1000 [I.V.:1000] Out: 150 [Blood:150]  BLOOD ADMINISTERED:none  DRAINS: none   LOCAL MEDICATIONS USED:  MARCAINE     SPECIMEN:  No Specimen  DISPOSITION OF SPECIMEN:  N/A  COUNTS:  YES  TOURNIQUET:  * No tourniquets in log *  DICTATION: .Other Dictation: Dictation Number [id number]  PLAN OF CARE: Admit to inpatient   PATIENT DISPOSITION:  PACU - hemodynamically stable.   Delay start of Pharmacological VTE agent (>24hrs) due to surgical blood loss or risk of bleeding: not applicable

## 2016-06-12 NOTE — Progress Notes (Signed)
Occupational Therapy Evaluation Patient Details Name: Olivia Holmes MRN: [id number] DOB: Aug 04, 1950 Today's Date: 06/12/2016    History of Present Illness 66 yo s/p R reverse TSA. See chart for PMH.   Clinical Impression   Pt seen this pm to begin eduction regarding compensatory techniques for ADL and shoulder protocol as indicated by MD. PTA, pt lived alone and was independent with ADL and mobility. Pt currently requires mod A with ADL. Ambulating @ hallway with S. No complains of dizziness/LOB. Will follow up tomorrow to complete education. Pt given written handouts to review tonight. Pt will have adequate help to DC home when medically stable.     Follow Up Recommendations  DC plan and follow up therapy as arranged by surgeon;Supervision - Intermittent    Equipment Recommendations  3 in 1 bedside commode    Recommendations for Other Services       Precautions / Restrictions Precautions Precautions: Shoulder Type of Shoulder Precautions: Active. See orders  Shoulder Interventions: Shoulder sling/immobilizer;For comfort (and sleep) Precaution Booklet Issued: Yes (comment) Restrictions Weight Bearing Restrictions: Yes RUE Weight Bearing: Non weight bearing      Mobility Bed Mobility Overal bed mobility: Needs Assistance (HOB elevated)             General bed mobility comments: educated on exiting bed without weight bearing through [place].  Transfers Overall transfer level: Needs assistance   Transfers: Sit to/from Stand Sit to Stand: Supervision              Balance Overall balance assessment: No apparent balance deficits (not formally assessed)                                         ADL either performed or assessed with clinical judgement   ADL Overall ADL's : Needs assistance/impaired                         Toilet Transfer: Supervision/safety   Toileting- Clothing Manipulation and Hygiene: Supervision/safety;Sit to/from  stand Toileting - Clothing Manipulation Details (indicate cue type and reason): able to use L hand     Functional mobility during ADLs: Supervision/safety General ADL Comments: see shoulder section. Began educaiton regarding compensatory technqieus for ADL. Educated pt/sister on correct positioning of RUE @ bed level. Pt instructed that per orders, pt to wear sling when sleeping and for comfort. Pt's sister stated that Dr. Veverly Fells told them she did not have to wear the sling.      Vision         Perception     Praxis      Pertinent Vitals/Pain Pain Assessment: No/denies pain     Hand Dominance Right   Extremity/Trunk Assessment Upper Extremity Assessment Upper Extremity Assessment: RUE deficits/detail RUE Deficits / Details: block intact. Pt moving hand. Tolerated 0-90 FF; 0-30 ER; 0-60 Abd   Lower Extremity Assessment Lower Extremity Assessment: Overall WFL for tasks assessed (complains of painful knees)   Cervical / Trunk Assessment Cervical / Trunk Assessment: Normal   Communication Communication Communication: HOH   Cognition Arousal/Alertness: Awake/alert Behavior During Therapy: WFL for tasks assessed/performed Overall Cognitive Status: Within Functional Limits for tasks assessed  General Comments       Exercises Exercises: Shoulder Shoulder Exercises Shoulder Flexion: Right;10 reps;PROM;Supine Shoulder ABduction: PROM;Right;10 reps;Supine Shoulder External Rotation: PROM;Right;10 reps;Supine Elbow Flexion: PROM;Right;10 reps;Supine Elbow Extension: PROM;Right;10 reps;Supine   Shoulder Instructions Shoulder Instructions Donning/doffing shirt without moving shoulder: Moderate assistance Method for sponge bathing under operated UE: Moderate assistance Donning/doffing sling/immobilizer: Moderate assistance Correct positioning of sling/immobilizer: Moderate assistance ROM for elbow, wrist and digits of  operated UE: Minimal assistance Sling wearing schedule (on at all times/off for ADL's): Minimal assistance Proper positioning of operated UE when showering: Minimal assistance Positioning of UE while sleeping: Minimal assistance    Home Living Family/patient expects to be discharged to:: Private residence Living Arrangements: Alone Available Help at Discharge: Family;Friend(s);Available PRN/intermittently Type of Home: House Home Access: Ramped entrance     Home Layout: One level     Bathroom Shower/Tub: Tub/shower unit;Curtain   Biochemist, clinical: Standard Bathroom Accessibility: Yes How Accessible: Accessible via walker Home Equipment: None          Prior Functioning/Environment Level of Independence: Independent                 OT Problem List: Decreased strength;Decreased range of motion;Decreased knowledge of use of DME or AE;Decreased knowledge of precautions;Pain;Obesity      OT Treatment/Interventions: Self-care/ADL training;Therapeutic exercise;DME and/or AE instruction;Therapeutic activities;Patient/family education    OT Goals(Current goals can be found in the care plan section) Acute Rehab OT Goals Patient Stated Goal: to be able to use my R arm OT Goal Formulation: With patient Time For Goal Achievement: 06/19/16 Potential to Achieve Goals: Good ADL Goals Pt Will Perform Upper Body Bathing: with supervision;with caregiver independent in assisting;sitting Pt Will Perform Upper Body Dressing: with supervision;with caregiver independent in assisting;sitting Pt/caregiver will Perform Home Exercise Program: Increased ROM;Right Upper extremity;With Supervision;With written HEP provided (per MD protocol) Additional ADL Goal #1: Pt will independtnly verbalize understanding of correct positioning of RUE in sitting/supine positions and use of sling.  OT Frequency: Min 3X/week   Barriers to D/C:            Co-evaluation              AM-PAC PT "6  Clicks" Daily Activity     Outcome Measure Help from another person eating meals?: A Little Help from another person taking care of personal grooming?: A Little Help from another person toileting, which includes using toliet, bedpan, or urinal?: A Little Help from another person bathing (including washing, rinsing, drying)?: A Little Help from another person to put on and taking off regular upper body clothing?: A Lot Help from another person to put on and taking off regular lower body clothing?: A Little 6 Click Score: 17   End of Session Equipment Utilized During Treatment: Gait belt (sling) Nurse Communication: Mobility status;Other (comment) (sling use/shoulder protocol)  Activity Tolerance: Patient tolerated treatment well Patient left: in bed;with call bell/phone within reach;with family/visitor present  OT Visit Diagnosis: Muscle weakness (generalized) (M62.81);Pain Pain - Right/Left: Right Pain - part of body: Shoulder                Time: 6962-9528 OT Time Calculation (min): 42 min Charges:  OT General Charges $OT Visit: 1 Procedure OT Evaluation $OT Eval Moderate Complexity: 1 Procedure OT Treatments $Self Care/Home Management : 8-22 mins $Therapeutic Exercise: 8-22 mins G-Codes:     [facility], OT/L  [phone] 06/12/2016  Francys Bolin,HILLARY 06/12/2016, 5:08 PM

## 2016-06-12 NOTE — Anesthesia Postprocedure Evaluation (Signed)
Anesthesia Post Note  Patient: Olivia Holmes  Procedure(s) Performed: Procedure(s) (LRB): REVERSE SHOULDER ARTHROPLASTY (Right)  Patient location during evaluation: PACU Anesthesia Type: General and Regional Level of consciousness: awake and alert Pain management: pain level controlled Vital Signs Assessment: post-procedure vital signs reviewed and stable Respiratory status: spontaneous breathing, nonlabored ventilation, respiratory function stable and patient connected to nasal cannula oxygen Cardiovascular status: blood pressure returned to baseline and stable Postop Assessment: no signs of nausea or vomiting Anesthetic complications: no       Last Vitals:  Vitals:   06/12/16 1018 06/12/16 1033  BP: (!) 112/58 (!) 107/58  Pulse: 69 66  Resp: 11 13  Temp:      Last Pain:  Vitals:   06/12/16 0613  TempSrc: Oral                 Geno Sydnor,W. EDMOND

## 2016-06-12 NOTE — Discharge Instructions (Signed)
Ice to the shoulder at all times, keep the incision covered and clean and dry for one week, then ok to get it wet in the shower.  Do exercises every hour or so for a few minutes, Pendulums, lap slides and rotation exercises.  Keep a pillow or blankets propped behind the right elbow to keep your arm across your waist.  No reaching behind back or heavy lifting.  Follow up in 10 days with Dr Veverly Fells [phone]

## 2016-06-12 NOTE — Op Note (Signed)
NAME:  Olivia Holmes, Olivia Holmes                   ACCOUNT NO.:  MEDICAL RECORD NO.:  [id number]  LOCATION:                                 FACILITY:  PHYSICIAN:  Doran Heater. Veverly Fells, M.D.      DATE OF BIRTH:  DATE OF PROCEDURE:  06/12/2016 DATE OF DISCHARGE:                              OPERATIVE REPORT   PREOPERATIVE DIAGNOSIS:  Right shoulder rotator cuff tear arthropathy.  POSTOPERATIVE DIAGNOSIS:  Right shoulder rotator cuff tear arthropathy.  PROCEDURE PERFORMED:  Right reverse total shoulder arthroplasty using DePuy Delta Xtend prosthesis.  ATTENDING SURGEON:  Doran Heater. Veverly Fells, MD.  ASSISTANT:  Judith Part. Chabon, PA.  ANESTHESIA:  General anesthesia was used plus interscalene block.  ESTIMATED BLOOD LOSS:  150 mL.  FLUID REPLACEMENT:  1500 mL crystalloid.  INSTRUMENT COUNTS:  Correct.  COMPLICATIONS:  There were no complications.  ANTIBIOTICS:  Perioperative antibiotics were given.  INDICATIONS:  The patient is a 66 year old female with worsening right shoulder pain and dysfunction secondary to end-stage rotator cuff tear arthropathy.  The patient has had progressive pain despite conservative management and desires operative treatment to restore function and eliminate pain.  Informed consent obtained.  DESCRIPTION OF PROCEDURE:  After an adequate level of anesthesia was achieved, the patient was positioned in the modified beach-chair position.  All neurovascular structures padded appropriately.  Right shoulder sterilely prepped and draped in usual manner.  Time-out called. We initiated shoulder surgery using the deltopectoral approach, started at the coracoid process extending down to the anterior humerus. Dissection was down through subcutaneous tissues using Bovie, identified cephalic vein, took it laterally to the deltoid, pectoralis taken medially.  Conjoint tendon identified and retracted medially.  We tenodesed the biceps in situ by suturing into the pec tendon.  We  then went and released the subscapularis subperiosteally off the lesser tuberosity.  We then went ahead and tagged for either repair or resection, but for protection of the axillary nerve.  We then progressively released the inferior capsule off the inferior humeral neck extending the shoulder and then delivering the humerus out of the wound.  We removed the remnant of the supraspinatus.  The most of the supraspinatus and infraspinatus was torn.  The teres minor was preserved.  We then entered the proximal humerus with a 6 mm reamer.  We reamed up to size 12.  We then placed our intramedullary resection guide and resected the head at 10 degrees of retroversion.  We took the head to the back table to potentially use for bone graft.  We then removed excess osteophytes off the medial head and at this point, we went ahead and sized this.  This was going to be an epi-1 right for the metaphyseal portion.  We placed our reaming guide and then reamed for the epi-1 right.  We then impacted the trial prosthesis.  A 12 body epi-1 right metaphyseal component set on the 0 setting and placed in 10 degrees of retroversion.  This gave [place] nice proximal fit and fill.  At this point retracted the humerus posteriorly, did a 360-degree capsular labral resection.  We were able to get good exposure of the glenoid.  We were protecting the axillary nerve the entire time with the retractors and able to digitally palpate that.  Once we had our glenoid exposed, we found the center point, drilled a guide pin, and then reamed for the metaglene and drilled our central peg hole and impacted the metaglene in position and placed a 42 lock screw inferiorly, a 36 lock screw superiorly.  Due to the small size of the glenoid, we could not get anterior and posterior screws.  We placed a 38 standard Glenosphere into position, impacted that and screwed it down until it was secured, made sure that the axillary nerve was free and  clear.  We then reduced the shoulder with a 38 +3 trial and felt like either that with a +6 would be appropriate offset.  We removed the trial components from the humeral side based on some bone loss proximally during the retraction and preparation of the glenoid.  We elected to proceed with a hybrid approach using the HA coated press-fit stem with some cement in the metaphyseal portion.  After irrigating and drying the canal, we vacuum mixed cement on the back table and hand packed that in the canal again just a little bit to meta-diaphyseal portion.  We hand packed it in there and then impacted the stem into place in 10 degrees of retroversion.  We left the proximal portion free of cement so that we get some HA healing.  Once we were certain that we had good security with that stem and there was the cemented set up, then we went ahead and trialed with again the +3 and felt like the +6 to be a better fit.  So, we selected a 38 +6 poly, impacted that in position, reduced the shoulder with a nice pop.  No gapping with external rotation, inferior pole and conjoint was tight.  An axillary nerve was not excessively tight.  We irrigated thoroughly, removed the remaining subscap as there was no significant lesser tuberosity tissue left to sew to.  We felt like that may limit excursion if we went further around the greater tuberosity.  We resected the end of the subscap.  We then irrigated thoroughly, repaired the deltopectoral interval with 0 Vicryl suture, followed by 2-0 Vicryl for subcutaneous closure, and 4-0 Monocryl for skin.  Steri-Strips applied, followed by sterile dressing.  The patient tolerated procedure well.     Doran Heater. Veverly Fells, M.D.     SRN/MEDQ  D:  06/12/2016  T:  06/12/2016  Job:  [id number]

## 2016-06-12 NOTE — Transfer of Care (Signed)
Immediate Anesthesia Transfer of Care Note  Patient: Olivia Holmes  Procedure(s) Performed: Procedure(s): REVERSE SHOULDER ARTHROPLASTY (Right)  Patient Location: PACU  Anesthesia Type:GA combined with regional for post-op pain  Level of Consciousness: awake, oriented and patient cooperative  Airway & Oxygen Therapy: Patient Spontanous Breathing and Patient connected to nasal cannula oxygen  Post-op Assessment: Report given to RN, Post -op Vital signs reviewed and stable and Patient moving all extremities  Post vital signs: Reviewed and stable  Last Vitals:  Vitals:   06/12/16 0613  BP: (!) 112/57  Pulse: 62  Resp: 20  Temp: 36.8 C    Last Pain:  Vitals:   06/12/16 0613  TempSrc: Oral         Complications: No apparent anesthesia complications

## 2016-06-13 LAB — BASIC METABOLIC PANEL
Anion gap: 9 (ref 5–15)
BUN: 13 mg/dL (ref 6–20)
CO2: 27 mmol/L (ref 22–32)
Calcium: 9.1 mg/dL (ref 8.9–10.3)
Chloride: 103 mmol/L (ref 101–111)
Creatinine, Ser: 0.75 mg/dL (ref 0.44–1.00)
GFR calc Af Amer: >60 mL/min (ref >60)
GFR calc non Af Amer: >60 mL/min (ref >60)
Glucose, Bld: 122 mg/dL — ABNORMAL HIGH (ref 65–99)
Potassium: 3.9 mmol/L (ref 3.5–5.1)
Sodium: 139 mmol/L (ref 135–145)

## 2016-06-13 LAB — HEMOGLOBIN AND HEMATOCRIT, BLOOD
HCT: 33.8 % — ABNORMAL LOW (ref 36.0–46.0)
Hemoglobin: 10.7 g/dL — ABNORMAL LOW (ref 12.0–15.0)

## 2016-06-13 LAB — GLUCOSE, CAPILLARY
Glucose-Capillary: 201 mg/dL — ABNORMAL HIGH (ref 65–99)
Glucose-Capillary: 147 mg/dL — ABNORMAL HIGH (ref 65–99)
Glucose-Capillary: 133 mg/dL — ABNORMAL HIGH (ref 65–99)

## 2016-06-13 MED ORDER — HYDROXYZINE HCL 50 MG/ML IM SOLN
50.0000 mg | Freq: Once | INTRAMUSCULAR | Status: AC
  Administered 2016-06-13: 50 mg via INTRAMUSCULAR
  Filled 2016-06-13: qty 1

## 2016-06-13 NOTE — Progress Notes (Signed)
Orthopedics Progress Note  Subjective: Feeling some pain today, controlled with po pain meds  Objective:  Vitals:   06/12/16 2351 06/13/16 0428  BP: (!) 95/57 104/62  Pulse: 65 63  Resp: 18 18  Temp: 98.3 F (36.8 C) 97.5 F (36.4 C)    General: Awake and alert  Musculoskeletal: right shoulder dressing changed, incision benign, min swelling, NVI Neurovascularly intact  Lab Results  Component Value Date   WBC 8.7 06/04/2016   HGB 10.7 (L) 06/13/2016   HCT 33.8 (L) 06/13/2016   MCV 90.3 06/04/2016   PLT 223 06/04/2016       Component Value Date/Time   NA 138 06/04/2016 1023   K 4.3 06/04/2016 1023   CL 103 06/04/2016 1023   CO2 25 06/04/2016 1023   GLUCOSE 161 (H) 06/04/2016 1023   BUN 15 06/04/2016 1023   CREATININE 0.72 06/04/2016 1023   CALCIUM 9.5 06/04/2016 1023   GFRNONAA >60 06/04/2016 1023   GFRAA >60 06/04/2016 1023    No results found for: INR, PROTIME  Assessment/Plan: POD #1 s/p Procedure(s): REVERSE SHOULDER ARTHROPLASTY PT,OT D/C planning Plan D/C to home with basic home exercises per OT F/U in two weeks with me  Remo Lipps R. Veverly Fells, MD 06/13/2016 6:25 AM

## 2016-06-13 NOTE — Discharge Summary (Addendum)
Physician Discharge Summary   Patient ID: Olivia Holmes MRN: [id number] DOB/AGE: May 11, 1950 66 y.o.  Admit date: 06/12/2016 Discharge date: 06/13/2016  Admission Diagnoses:  Active Problems:   S/P shoulder replacement, right   Discharge Diagnoses:  Same   Surgeries: Procedure(s): REVERSE SHOULDER ARTHROPLASTY on 06/12/2016   Consultants: OT  Discharged Condition: Stable  Hospital Course: Olivia Holmes is an 66 y.o. female who was admitted 06/12/2016 with a chief complaint of right shoulder pain, and found to have a diagnosis of right shoulder rotator cuff insufficiency and end stage primary OA.  They were brought to the operating room on 06/12/2016 and underwent the above named procedures.    The patient had an uncomplicated hospital course and was stable for discharge.  Recent vital signs:  Vitals:   06/12/16 2351 06/13/16 0428  BP: (!) 95/57 104/62  Pulse: 65 63  Resp: 18 18  Temp: 98.3 F (36.8 C) 97.5 F (36.4 C)    Recent laboratory studies:  Results for orders placed or performed during the hospital encounter of 06/12/16  Glucose, capillary  Result Value Ref Range   Glucose-Capillary 211 (H) 65 - 99 mg/dL   Comment 1 Notify RN    Comment 2 Document in Chart   Glucose, capillary  Result Value Ref Range   Glucose-Capillary 193 (H) 65 - 99 mg/dL  Glucose, capillary  Result Value Ref Range   Glucose-Capillary 202 (H) 65 - 99 mg/dL  Hemoglobin and hematocrit, blood  Result Value Ref Range   Hemoglobin 10.7 (L) 12.0 - 15.0 g/dL   HCT 33.8 (L) 36.0 - 46.0 %  Glucose, capillary  Result Value Ref Range   Glucose-Capillary 238 (H) 65 - 99 mg/dL  Glucose, capillary  Result Value Ref Range   Glucose-Capillary 117 (H) 65 - 99 mg/dL   Comment 1 Notify RN    Comment 2 Document in Chart     Discharge Medications:   Allergies as of 06/13/2016      Reactions   Erythromycin Other (See Comments)   Stomach cramps   Penicillins Swelling   Has patient had a PCN  reaction causing immediate rash, facial/tongue/throat swelling, SOB or lightheadedness with hypotension: Yes Has patient had a PCN reaction causing severe rash involving mucus membranes or skin necrosis: No Has patient had a PCN reaction that required hospitalization No Has patient had a PCN reaction occurring within the last 10 years: No If all of the above answers are "NO", then may proceed with Cephalosporin use.   Tussin Cough [dextromethorphan Hbr] Other (See Comments)   Dizziness and disoriented      Medication List    TAKE these medications   albuterol 108 (90 Base) MCG/ACT inhaler Commonly known as:  PROVENTIL HFA;VENTOLIN HFA Inhale 2 puffs into the lungs every 6 (six) hours as needed for wheezing or shortness of breath.   aspirin EC 81 MG tablet Take 81 mg by mouth every evening.   atorvastatin 20 MG tablet Commonly known as:  LIPITOR Take 20 mg by mouth daily at 6 PM.   cyclobenzaprine 10 MG tablet Commonly known as:  FLEXERIL Take 10 mg by mouth daily as needed for muscle spasms.   fluticasone 50 MCG/ACT nasal spray Commonly known as:  FLONASE Place 1 spray into both nostrils daily as needed for allergies or rhinitis.   glipiZIDE 5 MG tablet Commonly known as:  GLUCOTROL Take 5 mg by mouth every evening.   GLUCOSAMINE-CHONDROITIN PO Take 1 tablet by mouth daily.  lisinopril 10 MG tablet Commonly known as:  PRINIVIL,ZESTRIL Take 10 mg by mouth daily.   metFORMIN 1000 MG tablet Commonly known as:  GLUCOPHAGE Take 1,000 mg by mouth 2 (two) times daily with a meal.   methocarbamol 500 MG tablet Commonly known as:  ROBAXIN Take 1 tablet (500 mg total) by mouth 3 (three) times daily as needed.   oxyCODONE-acetaminophen 5-325 MG tablet Commonly known as:  ROXICET Take 1-2 tablets by mouth every 4 (four) hours as needed for severe pain.   sertraline 100 MG tablet Commonly known as:  ZOLOFT Take 50 mg by mouth daily.     Discontinue OXYCODONE, Start  Norco 5/325 1-2 tabs po q 6 hours prn pain    Diagnostic Studies: Dg Shoulder Right Port  Result Date: 06/12/2016 CLINICAL DATA:  Right shoulder replacement EXAM: PORTABLE RIGHT SHOULDER COMPARISON:  None. FINDINGS: Interval total reverse right shoulder arthroplasty without failure or complication. No fracture or dislocation. IMPRESSION: Interval total reverse right shoulder arthroplasty without failure or complication. Electronically Signed   By: Kathreen Devoid   On: 06/12/2016 11:40    Disposition: home  Discharge Instructions    Call MD / Call 911    Complete by:  As directed    If you experience chest pain or shortness of breath, CALL 911 and be transported to the hospital emergency room.  If you develope a fever above 101 F, pus (white drainage) or increased drainage or redness at the wound, or calf pain, call your surgeon's office.   Constipation Prevention    Complete by:  As directed    Drink plenty of fluids.  Prune juice may be helpful.  You may use a stool softener, such as Colace (over the counter) 100 mg twice a day.  Use MiraLax (over the counter) for constipation as needed.   Diet - low sodium heart healthy    Complete by:  As directed    Increase activity slowly as tolerated    Complete by:  As directed       Follow-up Information    Netta Cedars, MD. Call in 10 days.   Specialty:  Orthopedic Surgery Why:  [phone] Contact information: [address] [place] [address] [phone]            Signed: Augustin Schooling 06/13/2016, 6:27 AM

## 2016-06-13 NOTE — Care Management Note (Signed)
Case Management Note  Patient Details  Name: Olivia Holmes MRN: [id number] Date of Birth: Jun 15, 1950  Subjective/Objective:                 Spoke with patient at bedside. She states she will follow up w Dr Veverly Fells and decide if she needs OP PT/OT at that time. She stated when she spoke with him this morning he did not anticipate her needing any. OT rec BSC. Pt verified she needed BSC. Spoke with nurses, they will obtain [facility] for patient prior to DC (from [facility] supply room). DC order in place. No further CM needs identified.    Action/Plan:  DC to home self care.  Expected Discharge Date:  06/13/16               Expected Discharge Plan:  Home/Self Care  In-House Referral:     Discharge planning Services  CM Consult  Post Acute Care Choice:    Choice offered to:     DME Arranged:    DME Agency:     HH Arranged:    HH Agency:     Status of Service:  Completed, signed off  If discussed at [employer] of Stay Meetings, dates discussed:    Additional Comments:  Carles Collet, RN 06/13/2016, 7:39 AM

## 2016-06-13 NOTE — Progress Notes (Signed)
Patient vomited during therapy. Medication given and MD notified of current situation. New orders received to transfer patient to [place]. And changed discharge order to tomorrow. Will continue to monitor

## 2016-06-13 NOTE — Progress Notes (Signed)
Occupational Therapy Treatment Patient Details Name: Olivia Holmes MRN: [id number] DOB: 07/08/1950 Today's Date: 06/13/2016    History of present illness 66 yo s/p R reverse TSA. See chart for PMH.   OT comments  Pt able to perform UB/LB dressing today with set up and supervision. Pt able to teach back shoulder exercises and perform self-ROM x10 each exercise. Reviewed all shoulder education with pt; she verbalized understanding. D/c plan remains appropriate. Will continue to follow acutely.   Follow Up Recommendations  DC plan and follow up therapy as arranged by surgeon;Supervision - Intermittent    Equipment Recommendations  3 in 1 bedside commode    Recommendations for Other Services      Precautions / Restrictions Precautions Precautions: Shoulder Type of Shoulder Precautions: Active. See orders  Shoulder Interventions: Shoulder sling/immobilizer;For comfort (and sleep) Precaution Booklet Issued: No Restrictions Weight Bearing Restrictions: Yes RUE Weight Bearing: Non weight bearing       Mobility Bed Mobility Overal bed mobility: Modified Independent             General bed mobility comments: HOB elevated with increased time  Transfers Overall transfer level: Needs assistance Equipment used: None Transfers: Sit to/from Stand Sit to Stand: Supervision         General transfer comment: supervision for safety due to dizziness and nausea    Balance Overall balance assessment: No apparent balance deficits (not formally assessed)                                         ADL either performed or assessed with clinical judgement   ADL Overall ADL's : Needs assistance/impaired Eating/Feeding: Set up;Sitting         Upper Body Bathing Details (indicate cue type and reason): Reviewed UB bathing technique, pt reports she is planning to wait a few days prior to bathing     Upper Body Dressing : Set up;Sitting Upper Body Dressing Details  (indicate cue type and reason): pt with good technique to don shirt Lower Body Dressing: Set up;Sit to/from stand Lower Body Dressing Details (indicate cue type and reason): pt able to don underwear and pants without assist             Functional mobility during ADLs: Supervision/safety General ADL Comments: Reviewed shoulder exercises, proper sling management, proper positioning of RUE in bed, NWB RUE.     Vision       Perception     Praxis      Cognition Arousal/Alertness: Awake/alert Behavior During Therapy: WFL for tasks assessed/performed Overall Cognitive Status: Within Functional Limits for tasks assessed                                          Exercises Exercises: Shoulder Shoulder Exercises Shoulder Flexion: Right;10 reps;Seated;AAROM;Self ROM Shoulder ABduction: Right;10 reps;AAROM;Self ROM;Seated Shoulder External Rotation: AAROM;Self ROM;Right;10 reps;Seated Elbow Flexion: AROM;Right;10 reps;Seated Wrist Flexion: AROM;Right;10 reps;Seated Digit Composite Flexion: AROM;Right;10 reps;Seated   Shoulder Instructions Shoulder Instructions Donning/doffing shirt without moving shoulder: Set-up Method for sponge bathing under operated UE: Supervision/safety Donning/doffing sling/immobilizer: Minimal assistance Correct positioning of sling/immobilizer: Supervision/safety ROM for elbow, wrist and digits of operated UE: Supervision/safety Sling wearing schedule (on at all times/off for ADL's): Modified independent Proper positioning of operated UE when showering: Supervision/safety Positioning of UE  while sleeping: Minimal assistance     General Comments      Pertinent Vitals/ Pain       Pain Assessment: Faces Faces Pain Scale: Hurts whole lot Pain Location: shoulder +nausea Pain Descriptors / Indicators: Grimacing;Guarding;Aching Pain Intervention(s): Monitored during session;Limited activity within patient's tolerance;Repositioned;Patient  requesting pain meds-RN notified  Home Living                                          Prior Functioning/Environment              Frequency  Min 3X/week        Progress Toward Goals  OT Goals(current goals can now be found in the care plan section)  Progress towards OT goals: Progressing toward goals  Acute Rehab OT Goals Patient Stated Goal: decrease pain and feel better OT Goal Formulation: With patient  Plan Discharge plan remains appropriate    Co-evaluation                 AM-PAC PT "6 Clicks" Daily Activity     Outcome Measure   Help from another person eating meals?: None Help from another person taking care of personal grooming?: A Little Help from another person toileting, which includes using toliet, bedpan, or urinal?: A Little Help from another person bathing (including washing, rinsing, drying)?: A Little Help from another person to put on and taking off regular upper body clothing?: A Little Help from another person to put on and taking off regular lower body clothing?: A Little 6 Click Score: 19    End of Session Equipment Utilized During Treatment: Other (comment) (sling)  OT Visit Diagnosis: Muscle weakness (generalized) (M62.81);Pain Pain - Right/Left: Right Pain - part of body: Shoulder   Activity Tolerance Patient tolerated treatment well (increased pain and nausea today)   Patient Left Other (comment);with call bell/phone within reach (sitting EOB )   Nurse Communication Mobility status;Patient requests pain meds;Other (comment) (pt with nausea)        Time: 9604-5409 OT Time Calculation (min): 26 min  Charges: OT General Charges $OT Visit: 1 Procedure OT Treatments $Self Care/Home Management : 8-22 mins $Therapeutic Exercise: 8-22 mins  Keishla Oyer A. Ulice Brilliant, M.S., OTR/L Pager: [place] 06/13/2016, 9:41 AM

## 2016-06-14 ENCOUNTER — Inpatient Hospital Stay (HOSPITAL_COMMUNITY): Payer: Medicare Other

## 2016-06-14 LAB — GLUCOSE, CAPILLARY
Glucose-Capillary: 168 mg/dL — ABNORMAL HIGH (ref 65–99)
Glucose-Capillary: 150 mg/dL — ABNORMAL HIGH (ref 65–99)
Glucose-Capillary: 118 mg/dL — ABNORMAL HIGH (ref 65–99)
Glucose-Capillary: 114 mg/dL — ABNORMAL HIGH (ref 65–99)

## 2016-06-14 NOTE — Progress Notes (Signed)
Orthopedics Progress Note  Subjective: Patient reports increased congestion.  She reports dark sputum/ productive cough  Objective:  Vitals:   06/14/16 1412 06/14/16 1414  BP: 106/67 110/64  Pulse:    Resp:    Temp:      General: Awake and alert  Musculoskeletal: right shoulder with improving AROM, dressing intact, NVI Neurovascularly intact  Lab Results  Component Value Date   WBC 8.7 06/04/2016   HGB 10.7 (L) 06/13/2016   HCT 33.8 (L) 06/13/2016   MCV 90.3 06/04/2016   PLT 223 06/04/2016       Component Value Date/Time   NA 139 06/13/2016 0453   K 3.9 06/13/2016 0453   CL 103 06/13/2016 0453   CO2 27 06/13/2016 0453   GLUCOSE 122 (H) 06/13/2016 0453   BUN 13 06/13/2016 0453   CREATININE 0.75 06/13/2016 0453   CALCIUM 9.1 06/13/2016 0453   GFRNONAA >60 06/13/2016 0453   GFRAA >60 06/13/2016 0453    No results found for: INR, PROTIME  Assessment/Plan: POD #1 s/p Procedure(s): REVERSE SHOULDER ARTHROPLASTY Patient feels that she is not safe to go home and requests short term SNF - [facility] Plan D/C to [facility] if bed available tomorrow - need FL-2  Remo Lipps R. Veverly Fells, MD 06/14/2016 4:26 PM

## 2016-06-14 NOTE — Progress Notes (Signed)
Patient walked around unit once with assistance.

## 2016-06-14 NOTE — Progress Notes (Signed)
OT Cancellation Note  Patient Details Name: Olivia Holmes MRN: [id number] DOB: [birth year]   Cancelled Treatment:    Reason Eval/Treat Not Completed: Fatigue/lethargy limiting ability to participate.  Pt. Has room completely dark, reviewed goals of skilled OT including necessary exercies.  States " oh those, ive done them two full sets already, i just want to rest and heal, i'm all doped up and that is not the time to be doing exercises.  I am also trying to pass gas and don't want anyone in here.  I really hope I don't go home today I just want to rest".  Reviewed we would attempt back as able, pt. States "i am really good, I promise i'll do another set later".    Janice Coffin, COTA/L 06/14/2016, 10:09 AM

## 2016-06-14 NOTE — Progress Notes (Signed)
Subjective: 2 Days Post-Op Procedure(s) (LRB): REVERSE SHOULDER ARTHROPLASTY (Right)  Patient reports pain as mild to moderate.  Reports that she is able to eat a little.  Admits to flatulence.  Denies fever, chills, CP, SOB.  Has concerns over dizziness today.  Reports that she is afraid to go home and also has concerns over her glycemic control at home.  Wants to stay one more day and then go to SNF.  Objective:   VITALS:  Temp:  [98 F (36.7 C)-98.4 F (36.9 C)] 98.4 F (36.9 C) (05/20 0357) Pulse Rate:  [59-74] 74 (05/20 0357) Resp:  [16] 16 (05/20 0357) BP: (113-121)/(49-62) 113/52 (05/20 0357) SpO2:  [93 %-94 %] 93 % (05/20 0357)  General: WDWN patient in NAD. Psych:  Appropriate mood and affect. Neuro:  A&O x 3, Moving all extremities, sensation intact to light touch HEENT:  EOMs intact Chest:  Even non-labored respirations Skin:  Dressing C/D/I, no rashes or lesions Extremities: warm/dry, mild edema, no erythema or echymosis.  No lymphadenopathy. Pulses: radial 2+ MSK:  ROM: full wrist ROM, MMT: 5/5 grip strength    LABS  Recent Labs  06/13/16 0453  HGB 10.7*    Recent Labs  06/13/16 0453  NA 139  K 3.9  CL 103  CO2 27  BUN 13  CREATININE 0.75  GLUCOSE 122*   No results for input(s): LABPT, INR in the last 72 hours.   Assessment/Plan: 2 Days Post-Op Procedure(s) (LRB): REVERSE SHOULDER ARTHROPLASTY (Right)  NWB RUE  I have sent a note to Dr. Veverly Fells about the patient and her concerns.  Waiting to her back.  Will place order for PT/OT.  I will contact nurse when I have an update.  I can place D/C order from home later today if need be.  Otherwise will likely plan on the patient staying another night.  Scripts on chart  Plan for 2 week outpatient post-op visit with Dr. Catalina Antigua, [facility] Office:  [phone]

## 2016-06-14 NOTE — Progress Notes (Signed)
Physical Therapy Treatment Patient Details Name: Olivia Holmes MRN: [id number] DOB: [birth year] Today's Date: 06/14/2016    History of Present Illness 66 yo s/p R reverse TSA. See chart for PMH.    PT Comments    Patient is s/p above surgery resulting in functional limitations due to the deficits listed below (see PT Problem List). Overall moving well; somewhat lightheaded during amb, and noted BPs low: 82/54 initially, and 106/67 with amb; Pt wishes to dc to SNF for short-term rehab prior to dc home, and this is not unreasonable, given low BPs and some unsteadiness with need for L UE support, pushing vitals machine, with amb;  Patient will benefit from skilled PT to increase their independence and safety with mobility to allow discharge to the venue listed below.      Follow Up Recommendations  Other (comment)     Equipment Recommendations  None recommended by PT    Recommendations for Other Services       Precautions / Restrictions Precautions Precautions: Shoulder Type of Shoulder Precautions: Active. See orders  Shoulder Interventions: Shoulder sling/immobilizer;For comfort (and sleep) Precaution Booklet Issued: No Restrictions RUE Weight Bearing: Non weight bearing    Mobility  Bed Mobility Overal bed mobility: Modified Independent             General bed mobility comments: HOB elevated with increased time  Transfers Overall transfer level: Needs assistance Equipment used: None Transfers: Sit to/from Stand Sit to Stand: Min guard         General transfer comment: Minguard for safety due to dizziness and nausea  Ambulation/Gait Ambulation/Gait assistance: Min guard Ambulation Distance (Feet): 150 Feet Assistive device:  (wtih LUE support, pushing vitals machine) Gait Pattern/deviations: Step-through pattern     General Gait Details: Cues to self-monitor for activity tolerance; some lightheadedness present   Stairs            Wheelchair  Mobility    Modified Rankin (Stroke Patients Only)       Balance Overall balance assessment: No apparent balance deficits (not formally assessed)                                          Cognition Arousal/Alertness: Awake/alert Behavior During Therapy: WFL for tasks assessed/performed Overall Cognitive Status: Within Functional Limits for tasks assessed                                        Exercises      General Comments General comments (skin integrity, edema, etc.): Made adjustments to sling for optimal fit      Pertinent Vitals/Pain Pain Assessment: 0-10 Pain Score: 4  Pain Location: Shoulder Pain Descriptors / Indicators: Grimacing;Guarding;Aching Pain Intervention(s): Limited activity within patient's tolerance    Home Living Family/patient expects to be discharged to:: Private residence Living Arrangements: Alone Available Help at Discharge: Family;Friend(s);Available PRN/intermittently Type of Home: House Home Access: Ramped entrance   Home Layout: One level Home Equipment: Cane - single point      Prior Function Level of Independence: Independent          PT Goals (current goals can now be found in the care plan section) Acute Rehab PT Goals Patient Stated Goal: decrease pain and feel better PT Goal Formulation: With patient Time For Goal Achievement:  06/21/16 Potential to Achieve Goals: Good    Frequency    Min 3X/week      PT Plan      Co-evaluation              AM-PAC PT "6 Clicks" Daily Activity  Outcome Measure  Difficulty turning over in bed (including adjusting bedclothes, sheets and blankets)?: A Little Difficulty moving from lying on back to sitting on the side of the bed? : A Little Difficulty sitting down on and standing up from a chair with arms (e.g., wheelchair, bedside commode, etc,.)?: A Little Help needed moving to and from a bed to chair (including a wheelchair)?: A Little Help  needed walking in hospital room?: None Help needed climbing 3-5 steps with a railing? : A Little 6 Click Score: 19    End of Session   Activity Tolerance: Patient tolerated treatment well Patient left: in bed;with call bell/phone within reach;with nursing/sitter in room Nurse Communication: Mobility status PT Visit Diagnosis: Unsteadiness on feet (R26.81)     Time: 7001-7494 PT Time Calculation (min) (ACUTE ONLY): 17 min  Charges:                       G Codes:       Roney Marion, PT  Acute Rehabilitation Services Pager [phone] Office [place] 06/14/2016, 5:11 PM

## 2016-06-15 ENCOUNTER — Encounter (HOSPITAL_COMMUNITY): Payer: Self-pay | Admitting: Orthopedic Surgery

## 2016-06-15 DIAGNOSIS — M25511 Pain in right shoulder: Secondary | ICD-10-CM | POA: Diagnosis not present

## 2016-06-15 DIAGNOSIS — Z471 Aftercare following joint replacement surgery: Secondary | ICD-10-CM | POA: Diagnosis not present

## 2016-06-15 DIAGNOSIS — J40 Bronchitis, not specified as acute or chronic: Secondary | ICD-10-CM | POA: Diagnosis not present

## 2016-06-15 DIAGNOSIS — E11319 Type 2 diabetes mellitus with unspecified diabetic retinopathy without macular edema: Secondary | ICD-10-CM | POA: Diagnosis not present

## 2016-06-15 DIAGNOSIS — R0602 Shortness of breath: Secondary | ICD-10-CM | POA: Diagnosis not present

## 2016-06-15 DIAGNOSIS — S4990XA Unspecified injury of shoulder and upper arm, unspecified arm, initial encounter: Secondary | ICD-10-CM | POA: Diagnosis not present

## 2016-06-15 DIAGNOSIS — R41841 Cognitive communication deficit: Secondary | ICD-10-CM | POA: Diagnosis not present

## 2016-06-15 DIAGNOSIS — E784 Other hyperlipidemia: Secondary | ICD-10-CM | POA: Diagnosis not present

## 2016-06-15 DIAGNOSIS — R2681 Unsteadiness on feet: Secondary | ICD-10-CM | POA: Diagnosis not present

## 2016-06-15 DIAGNOSIS — M62838 Other muscle spasm: Secondary | ICD-10-CM | POA: Diagnosis not present

## 2016-06-15 DIAGNOSIS — F3289 Other specified depressive episodes: Secondary | ICD-10-CM | POA: Diagnosis not present

## 2016-06-15 DIAGNOSIS — J45998 Other asthma: Secondary | ICD-10-CM | POA: Diagnosis not present

## 2016-06-15 DIAGNOSIS — Z96611 Presence of right artificial shoulder joint: Secondary | ICD-10-CM | POA: Diagnosis not present

## 2016-06-15 DIAGNOSIS — G8911 Acute pain due to trauma: Secondary | ICD-10-CM | POA: Diagnosis not present

## 2016-06-15 DIAGNOSIS — R278 Other lack of coordination: Secondary | ICD-10-CM | POA: Diagnosis not present

## 2016-06-15 LAB — GLUCOSE, CAPILLARY
Glucose-Capillary: 143 mg/dL — ABNORMAL HIGH (ref 65–99)
Glucose-Capillary: 114 mg/dL — ABNORMAL HIGH (ref 65–99)

## 2016-06-15 MED ORDER — HYDROCODONE-ACETAMINOPHEN 5-325 MG PO TABS: 1.0000 | ORAL_TABLET | Freq: Four times a day (QID) | ORAL | 0 refills | Status: AC | PRN

## 2016-06-15 MED ORDER — BISACODYL 10 MG RE SUPP
10.0000 mg | Freq: Once | RECTAL | Status: AC
  Administered 2016-06-15: 10 mg via RECTAL
  Filled 2016-06-15: qty 1

## 2016-06-15 MED ORDER — HYDROCODONE-ACETAMINOPHEN 5-325 MG PO TABS: 1.0000 | ORAL_TABLET | Freq: Four times a day (QID) | ORAL | Status: DC | PRN

## 2016-06-15 NOTE — Progress Notes (Signed)
PT Cancellation Note  Patient Details Name: Olivia Holmes MRN: [id number] DOB: 12-24-1950   Cancelled Treatment:    Reason Eval/Treat Not Completed: Patient declined, no reason specified. Pt reports she has done her exercised multiple times today and has been up walking in the hall. She is not interested in receiving physical therapy at this time. Will continue to follow if still here tomorrow.  Benjiman Core, PTA Acute Rehab [place] 06/15/2016, 12:00 PM

## 2016-06-15 NOTE — Progress Notes (Signed)
Pt discharge education completed. Report called off to [facility] a nurse at [employer]. Pt discharge to [facility] and awaiting on PTAR to transport off to disposition. Pt IV removed; RUE incision dsg remains clean, dry and intact with no active bleeding or stain noted. RUE remains in a sling. Will continue to closely monitor pt till pick up by PTAR. Delia Heady RN

## 2016-06-15 NOTE — NC FL2 (Signed)
[place] LEVEL OF CARE SCREENING TOOL     IDENTIFICATION  Patient Name: Olivia Holmes Birthdate: 1950-01-28 Sex: female Admission Date (Current Location): 06/12/2016  [facility] and [place] Number:  Herbalist and Address:  [facility], [place] [address], [place], [place] [address]      Provider Number: [id number]  Attending Physician Name and Address:  Netta Cedars, MD  Relative Name and Phone Number:       Current Level of Care: Hospital Recommended Level of Care: [place] Prior Approval Number:    Date Approved/Denied: 06/15/16 PASRR Number: [id number]  Discharge Plan: SNF    Current Diagnoses: Patient Active Problem List   Diagnosis Date Noted  . S/P shoulder replacement, right 06/12/2016    Orientation RESPIRATION BLADDER Height & Weight     Self, Time, Situation, Place  Normal Continent Weight: 220 lb (99.8 kg) Height:     BEHAVIORAL SYMPTOMS/MOOD NEUROLOGICAL BOWEL NUTRITION STATUS      Continent Diet (See Dc Summary)  AMBULATORY STATUS COMMUNICATION OF NEEDS Skin   Limited Assist Verbally Surgical wounds (Closed Right Shoulder Incision with Foam Dressing)                       Personal Care Assistance Level of Assistance  Bathing, Feeding, Dressing Bathing Assistance: Limited assistance Feeding assistance: Limited assistance Dressing Assistance: Limited assistance     Functional Limitations Info  Sight, Hearing, Speech Sight Info: Adequate Hearing Info: Adequate Speech Info: Adequate    SPECIAL CARE FACTORS FREQUENCY  PT (By licensed PT), OT (By licensed OT)     PT Frequency: 5xweek OT Frequency: 5xweek            Contractures      Additional Factors Info  Code Status, Allergies, Psychotropic, Insulin Sliding Scale Code Status Info: Full Allergies Info: ERYTHROMYCIN, PENICILLINS, TUSSIN COUGH DEXTROMETHORPHAN HBR  Psychotropic Info: Zoloft Insulin Sliding Scale  Info: 20 units 3x's day; 6 units 3x's day; 5 units at bedtime       Current Medications (06/15/2016):  This is the current hospital active medication list Current Facility-Administered Medications  Medication Dose Route Frequency Provider Last Rate Last Dose  . 0.9 %  sodium chloride infusion   Intravenous Continuous Netta Cedars, MD   Stopped at 06/14/16 1900  . acetaminophen (TYLENOL) tablet 650 mg  650 mg Oral Q6H PRN Netta Cedars, MD   650 mg at 06/15/16 0122   Or  . acetaminophen (TYLENOL) suppository 650 mg  650 mg Rectal Q6H PRN Netta Cedars, MD      . albuterol (PROVENTIL) (2.5 MG/3ML) 0.083% nebulizer solution 2.5 mg  2.5 mg Inhalation Q6H PRN Netta Cedars, MD      . aspirin EC tablet 81 mg  81 mg Oral QPM Netta Cedars, MD   81 mg at 06/14/16 1637  . atorvastatin (LIPITOR) tablet 20 mg  20 mg Oral q1800 Netta Cedars, MD   20 mg at 06/14/16 1637  . cyclobenzaprine (FLEXERIL) tablet 10 mg  10 mg Oral Daily PRN Netta Cedars, MD      . docusate sodium (COLACE) capsule 100 mg  100 mg Oral BID Netta Cedars, MD   100 mg at 06/14/16 1934  . fluticasone (FLONASE) 50 MCG/ACT nasal spray 1 spray  1 spray Each Nare Daily PRN Netta Cedars, MD      . glipiZIDE (GLUCOTROL) tablet 5 mg  5 mg Oral QPM Netta Cedars, MD  5 mg at 06/14/16 1637  . HYDROcodone-acetaminophen (NORCO/VICODIN) 5-325 MG per tablet 1-2 tablet  1-2 tablet Oral Q6H PRN Netta Cedars, MD      . insulin aspart (novoLOG) injection 0-20 Units  0-20 Units Subcutaneous TID WC Netta Cedars, MD   3 Units at 06/15/16 [phone]  . insulin aspart (novoLOG) injection 0-5 Units  0-5 Units Subcutaneous QHS Netta Cedars, MD      . insulin aspart (novoLOG) injection 6 Units  6 Units Subcutaneous TID WC Netta Cedars, MD   6 Units at 06/15/16 [phone]  . lisinopril (PRINIVIL,ZESTRIL) tablet 10 mg  10 mg Oral Daily Netta Cedars, MD   10 mg at 06/14/16 5621  . menthol-cetylpyridinium (CEPACOL) lozenge 3 mg  1 lozenge Oral PRN Netta Cedars, MD        Or  . phenol [facility]) mouth spray 1 spray  1 spray Mouth/Throat PRN Netta Cedars, MD      . metFORMIN (GLUCOPHAGE) tablet 1,000 mg  1,000 mg Oral BID WC Netta Cedars, MD   1,000 mg at 06/15/16 0813  . methocarbamol (ROBAXIN) tablet 500 mg  500 mg Oral Q6H PRN Netta Cedars, MD   500 mg at 06/15/16 0425   Or  . methocarbamol (ROBAXIN) 500 mg in dextrose 5 % 50 mL IVPB  500 mg Intravenous Q6H PRN Netta Cedars, MD      . metoCLOPramide (REGLAN) tablet 5-10 mg  5-10 mg Oral Q8H PRN Netta Cedars, MD   10 mg at 06/13/16 3086   Or  . metoCLOPramide (REGLAN) injection 5-10 mg  5-10 mg Intravenous Q8H PRN Netta Cedars, MD      . morphine 4 MG/ML injection 2-3 mg  2-3 mg Intravenous Q2H PRN Netta Cedars, MD      . ondansetron [facility]) tablet 4 mg  4 mg Oral Q6H PRN Netta Cedars, MD       Or  . ondansetron [facility]) injection 4 mg  4 mg Intravenous Q6H PRN Netta Cedars, MD      . polyethylene glycol (MIRALAX / GLYCOLAX) packet 17 g  17 g Oral Daily PRN Netta Cedars, MD      . sertraline (ZOLOFT) tablet 50 mg  50 mg Oral Daily Netta Cedars, MD   50 mg at 06/14/16 0920     Discharge Medications: Please see discharge summary for a list of discharge medications.  Relevant Imaging Results:  Relevant Lab Results:   Additional Information [id number]  Normajean Baxter, LCSW

## 2016-06-15 NOTE — Progress Notes (Signed)
Occupational Therapy Treatment Patient Details Name: Olivia Holmes MRN: [id number] DOB: 1950/03/08 Today's Date: 06/15/2016    History of present illness 66 yo s/p R reverse TSA. See chart for PMH.   OT comments  Pt progressing towards established goals. Pt is supervision for transfers and functional mobility. Reviewed dressing, bathing, positioning, and exercises with pt and family friend. Pt demonstrating good understanding of education and ADLs. Pt reports she is waiting to dc to SNF for ST rehab stay to optimize independence; feel pt would benefit from [facility]. Will continue to follow as admitted to facilitate safe dc.    Follow Up Recommendations  DC plan and follow up therapy as arranged by surgeon;Supervision - Intermittent    Equipment Recommendations  3 in 1 bedside commode    Recommendations for Other Services      Precautions / Restrictions Precautions Precautions: Shoulder Type of Shoulder Precautions: Active. See orders  Shoulder Interventions: Shoulder sling/immobilizer;For comfort (and sleep) Precaution Booklet Issued: No Restrictions Weight Bearing Restrictions: Yes RUE Weight Bearing: Non weight bearing       Mobility Bed Mobility Overal bed mobility: Modified Independent             General bed mobility comments: HOB elevated with increased time  Transfers Overall transfer level: Needs assistance Equipment used: None Transfers: Sit to/from Stand Sit to Stand: Supervision              Balance Overall balance assessment: No apparent balance deficits (not formally assessed)                                         ADL either performed or assessed with clinical judgement   ADL Overall ADL's : Needs assistance/impaired           Upper Body Bathing Details (indicate cue type and reason): Reviewed UB bathing technique, pt reports she is planning to wait a few days prior to bathing       Upper Body Dressing Details  (indicate cue type and reason): reviewed good technique to don shirt     Toilet Transfer: Supervision/safety   Toileting- Clothing Manipulation and Hygiene: Supervision/safety;Sit to/from stand Toileting - Clothing Manipulation Details (indicate cue type and reason): able to use L hand     Functional mobility during ADLs: Supervision/safety General ADL Comments: Reviewed shoulder exercises, proper sling management, proper positioning of RUE in bed, NWB RUE. (provided information to family friend who will be assisting)     Vision       Perception     Praxis      Cognition Arousal/Alertness: Awake/alert Behavior During Therapy: WFL for tasks assessed/performed Overall Cognitive Status: Within Functional Limits for tasks assessed                                          Exercises Exercises: Shoulder   Shoulder Instructions Shoulder Instructions Donning/doffing shirt without moving shoulder: Set-up Method for sponge bathing under operated UE: Supervision/safety Donning/doffing sling/immobilizer: Minimal assistance Correct positioning of sling/immobilizer: Supervision/safety ROM for elbow, wrist and digits of operated UE: Supervision/safety Sling wearing schedule (on at all times/off for ADL's): Modified independent Proper positioning of operated UE when showering: Supervision/safety Positioning of UE while sleeping: Minimal assistance     General Comments Pt reports being frustrated because  she hasnt had a BM. Pt has small BM during session; communicated with RN    Pertinent Vitals/ Pain       Pain Assessment: Faces Faces Pain Scale: Hurts a little bit Pain Location: Shoulder Pain Descriptors / Indicators: Grimacing;Guarding;Aching Pain Intervention(s): Monitored during session  Home Living                                          Prior Functioning/Environment              Frequency  Min 3X/week        Progress Toward  Goals  OT Goals(current goals can now be found in the care plan section)  Progress towards OT goals: Progressing toward goals  Acute Rehab OT Goals Patient Stated Goal: decrease pain and feel better OT Goal Formulation: With patient Time For Goal Achievement: 06/19/16 Potential to Achieve Goals: Good ADL Goals Pt Will Perform Upper Body Bathing: with supervision;with caregiver independent in assisting;sitting Pt Will Perform Upper Body Dressing: with supervision;with caregiver independent in assisting;sitting Pt/caregiver will Perform Home Exercise Program: Increased ROM;Right Upper extremity;With Supervision;With written HEP provided (per MD protocol) Additional ADL Goal #1: Pt will independtnly verbalize understanding of correct positioning of RUE in sitting/supine positions and use of sling.  Plan Discharge plan remains appropriate    Co-evaluation                 AM-PAC PT "6 Clicks" Daily Activity     Outcome Measure   Help from another person eating meals?: None Help from another person taking care of personal grooming?: A Little Help from another person toileting, which includes using toliet, bedpan, or urinal?: A Little Help from another person bathing (including washing, rinsing, drying)?: A Little Help from another person to put on and taking off regular upper body clothing?: A Little Help from another person to put on and taking off regular lower body clothing?: A Little 6 Click Score: 19    End of Session Equipment Utilized During Treatment: Other (comment) (sling)  OT Visit Diagnosis: Muscle weakness (generalized) (M62.81);Pain Pain - Right/Left: Right Pain - part of body: Shoulder   Activity Tolerance Patient tolerated treatment well (increased pain and nausea today)   Patient Left with call bell/phone within reach;with family/visitor present;in chair   Nurse Communication Mobility status        Time: 8527-7824 OT Time Calculation (min): 29  min  Charges: OT General Charges $OT Visit: 1 Procedure OT Treatments $Self Care/Home Management : 23-37 mins  [place], OTR/L [place] 06/15/2016, 10:31 AM

## 2016-06-15 NOTE — Discharge Summary (Signed)
Physician Discharge Summary   Patient ID: Olivia Holmes MRN: [id number] DOB/AGE: [birth year] 66 y.o.  Admit date: 06/12/2016 Discharge date: 06/15/2016  Admission Diagnoses:  Active Problems:   S/P shoulder replacement, right   Discharge Diagnoses:  Same   Surgeries: Procedure(s): REVERSE SHOULDER ARTHROPLASTY on 06/12/2016   Consultants: PT/OT  Discharged Condition: Stable  Hospital Course: Olivia Holmes is an 66 y.o. 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  They were brought to the operating room on 06/12/2016 and underwent the above named procedures.    The patient had an uncomplicated hospital course and was stable for discharge.  Recent vital signs:  Vitals:   06/15/16 0424 06/15/16 0829  BP: (!) 100/52 (!) 104/38  Pulse: 62 71  Resp: 18 18  Temp: 97.8 F (36.6 C) 98.1 F (36.7 C)    Recent laboratory studies:  Results for orders placed or performed during the hospital encounter of 06/12/16  Glucose, capillary  Result Value Ref Range   Glucose-Capillary 211 (H) 65 - 99 mg/dL   Comment 1 Notify RN    Comment 2 Document in Chart   Glucose, capillary  Result Value Ref Range   Glucose-Capillary 193 (H) 65 - 99 mg/dL  Glucose, capillary  Result Value Ref Range   Glucose-Capillary 202 (H) 65 - 99 mg/dL  Hemoglobin and hematocrit, blood  Result Value Ref Range   Hemoglobin 10.7 (L) 12.0 - 15.0 g/dL   HCT 33.8 (L) 36.0 - 99.3 %  Basic metabolic panel  Result Value Ref Range   Sodium 139 135 - 145 mmol/L   Potassium 3.9 3.5 - 5.1 mmol/L   Chloride 103 101 - 111 mmol/L   CO2 27 22 - 32 mmol/L   Glucose, Bld 122 (H) 65 - 99 mg/dL   BUN 13 6 - 20 mg/dL   Creatinine, Ser 0.75 0.44 - 1.00 mg/dL   Calcium 9.1 8.9 - 10.3 mg/dL   GFR calc non Af Amer >60 >60 mL/min   GFR calc Af Amer >60 >60 mL/min   Anion gap 9 5 - 15  Glucose, capillary  Result Value  Ref Range   Glucose-Capillary 238 (H) 65 - 99 mg/dL  Glucose, capillary  Result Value Ref Range   Glucose-Capillary 117 (H) 65 - 99 mg/dL   Comment 1 Notify RN    Comment 2 Document in Chart   Glucose, capillary  Result Value Ref Range   Glucose-Capillary 201 (H) 65 - 99 mg/dL  Glucose, capillary  Result Value Ref Range   Glucose-Capillary 147 (H) 65 - 99 mg/dL  Glucose, capillary  Result Value Ref Range   Glucose-Capillary 133 (H) 65 - 99 mg/dL  Glucose, capillary  Result Value Ref Range   Glucose-Capillary 168 (H) 65 - 99 mg/dL  Glucose, capillary  Result Value Ref Range   Glucose-Capillary 150 (H) 65 - 99 mg/dL  Glucose, capillary  Result Value Ref Range   Glucose-Capillary 118 (H) 65 - 99 mg/dL  Glucose, capillary  Result Value Ref Range   Glucose-Capillary 114 (H) 65 - 99 mg/dL  Glucose, capillary  Result Value Ref Range   Glucose-Capillary 143 (H) 65 - 99 mg/dL  Glucose, capillary  Result Value Ref Range   Glucose-Capillary 114 (H) 65 - 99 mg/dL    Discharge Medications:   Allergies as of 06/15/2016      Reactions   Erythromycin Other (See  Comments)   Stomach cramps   Penicillins Swelling   Has patient had a PCN reaction causing immediate rash, facial/tongue/throat swelling, SOB or lightheadedness with hypotension: Yes Has patient had a PCN reaction causing severe rash involving mucus membranes or skin necrosis: No Has patient had a PCN reaction that required hospitalization No Has patient had a PCN reaction occurring within the last 10 years: No If all of the above answers are "NO", then may proceed with Cephalosporin use.   Tussin Cough [dextromethorphan Hbr] Other (See Comments)   Dizziness and disoriented      Medication List    TAKE these medications   albuterol 108 (90 Base) MCG/ACT inhaler Commonly known as:  PROVENTIL HFA;VENTOLIN HFA Inhale 2 puffs into the lungs every 6 (six) hours as needed for wheezing or shortness of breath.   aspirin EC 81  MG tablet Take 81 mg by mouth every evening.   atorvastatin 20 MG tablet Commonly known as:  LIPITOR Take 20 mg by mouth daily at 6 PM.   cyclobenzaprine 10 MG tablet Commonly known as:  FLEXERIL Take 10 mg by mouth daily as needed for muscle spasms.   fluticasone 50 MCG/ACT nasal spray Commonly known as:  FLONASE Place 1 spray into both nostrils daily as needed for allergies or rhinitis.   glipiZIDE 5 MG tablet Commonly known as:  GLUCOTROL Take 5 mg by mouth every evening.   GLUCOSAMINE-CHONDROITIN PO Take 1 tablet by mouth daily.   HYDROcodone-acetaminophen 5-325 MG tablet Commonly known as:  NORCO/VICODIN Take 1-2 tablets by mouth every 6 (six) hours as needed for moderate pain.   lisinopril 10 MG tablet Commonly known as:  PRINIVIL,ZESTRIL Take 10 mg by mouth daily.   metFORMIN 1000 MG tablet Commonly known as:  GLUCOPHAGE Take 1,000 mg by mouth 2 (two) times daily with a meal.   methocarbamol 500 MG tablet Commonly known as:  ROBAXIN Take 1 tablet (500 mg total) by mouth 3 (three) times daily as needed.   sertraline 100 MG tablet Commonly known as:  ZOLOFT Take 50 mg by mouth daily.       Diagnostic Studies: Dg Chest 2 View  Result Date: 06/14/2016 CLINICAL DATA:  Status post right shoulder replacement. Productive cough. EXAM: CHEST  2 VIEW COMPARISON:  May 19, 2005 FINDINGS: No pneumothorax. Infiltrate in medial right lung base. No other interval changes or acute abnormalities. IMPRESSION: Infiltrate in the medial right lung base. Recommend follow-up to resolution. Electronically Signed   By: Dorise Bullion III M.D   On: 06/14/2016 22:39   Dg Shoulder Right Port  Result Date: 06/12/2016 CLINICAL DATA:  Right shoulder replacement EXAM: PORTABLE RIGHT SHOULDER COMPARISON:  None. FINDINGS: Interval total reverse right shoulder arthroplasty without failure or complication. No fracture or dislocation. IMPRESSION: Interval total reverse right shoulder  arthroplasty without failure or complication. Electronically Signed   By: Kathreen Devoid   On: 06/12/2016 11:40    Disposition: Final discharge disposition not confirmed  Discharge Instructions    Call MD / Call 911    Complete by:  As directed    If you experience chest pain or shortness of breath, CALL 911 and be transported to the hospital emergency room.  If you develope a fever above 101 F, pus (white drainage) or increased drainage or redness at the wound, or calf pain, call your surgeon's office.   Call MD / Call 911    Complete by:  As directed    If you experience chest pain  or shortness of breath, CALL 911 and be transported to the hospital emergency room.  If you develope a fever above 101 F, pus (white drainage) or increased drainage or redness at the wound, or calf pain, call your surgeon's office.   Call MD / Call 911    Complete by:  As directed    If you experience chest pain or shortness of breath, CALL 911 and be transported to the hospital emergency room.  If you develope a fever above 101 F, pus (white drainage) or increased drainage or redness at the wound, or calf pain, call your surgeon's office.   Constipation Prevention    Complete by:  As directed    Drink plenty of fluids.  Prune juice may be helpful.  You may use a stool softener, such as Colace (over the counter) 100 mg twice a day.  Use MiraLax (over the counter) for constipation as needed.   Constipation Prevention    Complete by:  As directed    Drink plenty of fluids.  Prune juice may be helpful.  You may use a stool softener, such as Colace (over the counter) 100 mg twice a day.  Use MiraLax (over the counter) for constipation as needed.   Constipation Prevention    Complete by:  As directed    Drink plenty of fluids.  Prune juice may be helpful.  You may use a stool softener, such as Colace (over the counter) 100 mg twice a day.  Use MiraLax (over the counter) for constipation as needed.   Diet - low sodium  heart healthy    Complete by:  As directed    Diet - low sodium heart healthy    Complete by:  As directed    Diet - low sodium heart healthy    Complete by:  As directed    Increase activity slowly as tolerated    Complete by:  As directed    Increase activity slowly as tolerated    Complete by:  As directed    Increase activity slowly as tolerated    Complete by:  As directed       Follow-up Information    Netta Cedars, MD. Call in 10 day(s).   Specialty:  Orthopedic Surgery Why:  [phone] Contact information: [address] [place] [address] [phone]            Signed: Ventura Bruns 06/15/2016, 2:36 PM

## 2016-06-15 NOTE — Progress Notes (Signed)
Pt picked up by [facility] to be transported off to disposition. Pt transported off unit via stretcher with belongings to the side. P. Amo Kearstin Learn RN 

## 2016-06-15 NOTE — Clinical Social Work Note (Addendum)
Clinical Social Worker facilitated patient discharge including contacting patient family and facility to confirm patient discharge plans.  Clinical information faxed to facility and family agreeable with plan.  CSW arranged ambulance transport via [facility] to [facility].  RN to call [phone] report prior to discharge. Patient going to Rm 209.  Clinical Social Worker will sign off for now as social work intervention is no longer needed. Please consult [place] again if new need arises.  Elissa Hefty, LCSW Clinical Social Worker [phone]

## 2016-06-15 NOTE — Clinical Social Work Note (Signed)
Clinical Social Work Assessment  Patient Details  Name: Olivia Holmes MRN: [id number] Date of Birth: 12/26/1950  Date of referral:  06/15/16               Reason for consult:  Facility Placement                Permission sought to share information with:  Facility Art therapist granted to share information::  Yes, Verbal Permission Granted  Name::      sister  Agency::  SNF  Relationship::     Contact Information:     Housing/Transportation Living arrangements for the past 2 months:  [place] of Information:  Patient Patient Interpreter Needed:  None Criminal Activity/Legal Involvement Pertinent to Current Situation/Hospitalization:  No - Comment as needed Significant Relationships:  Siblings Lives with:  Self Do you feel safe going back to the place where you live?  No Need for family participation in patient care:  No (Coment)  Care giving concerns:  Patient resided alone prior to hospitalization. Patient unsafe to return home at this time.  Social Worker assessment / plan:  CSW met with patient this day and discussed DC plan to SNF. CSW explained role and SNF options/placement. Patient  has experience with SNF and indicated that she wants to go to [facility] at DC. CSW will f/u on placement.  FL2 complete, PASSR obtained, and offers sent.  Employment status:  Retired Nurse, adult PT Recommendations:  [place] / Referral to community resources:  [place]  Patient/Family's Response to care:  Patient appreciative of assistance from [place] for placement. No issues reported at this time.  Patient/Family's Understanding of and Emotional Response to Diagnosis, Current Treatment, and Prognosis:  Patient has good understanding of diagnosis, current treatment and prognosis. Patient hopeful SNF will improve impairment. No issues reported at this time.  Emotional  Assessment Appearance:  Appears stated age Attitude/Demeanor/Rapport:   (Cooperative) Affect (typically observed):  Accepting, Appropriate Orientation:  Oriented to Self, Oriented to Place, Oriented to  Time, Oriented to Situation Alcohol / Substance use:  Not Applicable Psych involvement (Current and /or in the community):  No (Comment)  Discharge Needs  Concerns to be addressed:  Care Coordination Readmission within the last 30 days:  No Current discharge risk:  Physical Impairment, Dependent with Mobility Barriers to Discharge:  No Barriers Identified   Normajean Baxter, LCSW 06/15/2016, 3:07 PM

## 2016-06-15 NOTE — Progress Notes (Signed)
Orthopedics Progress Note  Subjective: Comfortable this morning.  Did complain of itching last night  Objective:  Vitals:   06/14/16 2344 06/15/16 0424  BP:  (!) 100/52  Pulse:  62  Resp:  18  Temp: 97.9 F (36.6 C) 97.8 F (36.6 C)    General: Awake and alert  Musculoskeletal: Right shoulder dressing CDI Neurovascularly intact  Lab Results  Component Value Date   WBC 8.7 06/04/2016   HGB 10.7 (L) 06/13/2016   HCT 33.8 (L) 06/13/2016   MCV 90.3 06/04/2016   PLT 223 06/04/2016       Component Value Date/Time   NA 139 06/13/2016 0453   K 3.9 06/13/2016 0453   CL 103 06/13/2016 0453   CO2 27 06/13/2016 0453   GLUCOSE 122 (H) 06/13/2016 0453   BUN 13 06/13/2016 0453   CREATININE 0.75 06/13/2016 0453   CALCIUM 9.1 06/13/2016 0453   GFRNONAA >60 06/13/2016 0453   GFRAA >60 06/13/2016 0453      Assessment/Plan: POD #3 s/p Procedure(s): REVERSE SHOULDER ARTHROPLASTY Change pain med to norco due to itching D/C to SNF ([facility]) when bed available  Olivia Holmes. Veverly Fells, MD 06/15/2016 7:10 AM

## 2016-06-20 DIAGNOSIS — Z471 Aftercare following joint replacement surgery: Secondary | ICD-10-CM | POA: Diagnosis not present

## 2016-06-20 DIAGNOSIS — F3289 Other specified depressive episodes: Secondary | ICD-10-CM | POA: Diagnosis not present

## 2016-06-20 DIAGNOSIS — E11319 Type 2 diabetes mellitus with unspecified diabetic retinopathy without macular edema: Secondary | ICD-10-CM | POA: Diagnosis not present

## 2016-06-20 DIAGNOSIS — J45998 Other asthma: Secondary | ICD-10-CM | POA: Diagnosis not present

## 2016-06-20 DIAGNOSIS — E784 Other hyperlipidemia: Secondary | ICD-10-CM | POA: Diagnosis not present

## 2016-06-23 DIAGNOSIS — Z471 Aftercare following joint replacement surgery: Secondary | ICD-10-CM | POA: Diagnosis not present

## 2016-06-23 DIAGNOSIS — Z96611 Presence of right artificial shoulder joint: Secondary | ICD-10-CM | POA: Diagnosis not present

## 2016-06-30 DIAGNOSIS — R918 Other nonspecific abnormal finding of lung field: Secondary | ICD-10-CM | POA: Diagnosis not present

## 2016-06-30 DIAGNOSIS — E1139 Type 2 diabetes mellitus with other diabetic ophthalmic complication: Secondary | ICD-10-CM | POA: Diagnosis not present

## 2016-07-15 DIAGNOSIS — R918 Other nonspecific abnormal finding of lung field: Secondary | ICD-10-CM | POA: Diagnosis not present

## 2016-07-15 DIAGNOSIS — Z6831 Body mass index (BMI) 31.0-31.9, adult: Secondary | ICD-10-CM | POA: Diagnosis not present

## 2016-07-15 DIAGNOSIS — Z96619 Presence of unspecified artificial shoulder joint: Secondary | ICD-10-CM | POA: Diagnosis not present

## 2016-07-15 DIAGNOSIS — E1139 Type 2 diabetes mellitus with other diabetic ophthalmic complication: Secondary | ICD-10-CM | POA: Diagnosis not present

## 2016-07-15 DIAGNOSIS — I10 Essential (primary) hypertension: Secondary | ICD-10-CM | POA: Diagnosis not present

## 2016-07-21 DIAGNOSIS — Z471 Aftercare following joint replacement surgery: Secondary | ICD-10-CM | POA: Diagnosis not present

## 2016-07-21 DIAGNOSIS — Z96611 Presence of right artificial shoulder joint: Secondary | ICD-10-CM | POA: Diagnosis not present

## 2016-09-01 DIAGNOSIS — Z96611 Presence of right artificial shoulder joint: Secondary | ICD-10-CM | POA: Diagnosis not present

## 2016-09-01 DIAGNOSIS — Z471 Aftercare following joint replacement surgery: Secondary | ICD-10-CM | POA: Diagnosis not present

## 2016-10-07 DIAGNOSIS — H35371 Puckering of macula, right eye: Secondary | ICD-10-CM | POA: Diagnosis not present

## 2016-10-07 DIAGNOSIS — H2513 Age-related nuclear cataract, bilateral: Secondary | ICD-10-CM | POA: Diagnosis not present

## 2016-10-07 DIAGNOSIS — H5203 Hypermetropia, bilateral: Secondary | ICD-10-CM | POA: Diagnosis not present

## 2016-10-07 DIAGNOSIS — E113293 Type 2 diabetes mellitus with mild nonproliferative diabetic retinopathy without macular edema, bilateral: Secondary | ICD-10-CM | POA: Diagnosis not present

## 2016-11-19 DIAGNOSIS — Z471 Aftercare following joint replacement surgery: Secondary | ICD-10-CM | POA: Diagnosis not present

## 2016-11-19 DIAGNOSIS — Z96611 Presence of right artificial shoulder joint: Secondary | ICD-10-CM | POA: Diagnosis not present

## 2016-11-26 DIAGNOSIS — H43811 Vitreous degeneration, right eye: Secondary | ICD-10-CM | POA: Diagnosis not present

## 2016-11-26 DIAGNOSIS — E113293 Type 2 diabetes mellitus with mild nonproliferative diabetic retinopathy without macular edema, bilateral: Secondary | ICD-10-CM | POA: Diagnosis not present

## 2016-12-09 DIAGNOSIS — I10 Essential (primary) hypertension: Secondary | ICD-10-CM | POA: Diagnosis not present

## 2016-12-09 DIAGNOSIS — E7849 Other hyperlipidemia: Secondary | ICD-10-CM | POA: Diagnosis not present

## 2016-12-09 DIAGNOSIS — E1139 Type 2 diabetes mellitus with other diabetic ophthalmic complication: Secondary | ICD-10-CM | POA: Diagnosis not present

## 2016-12-09 DIAGNOSIS — Z780 Asymptomatic menopausal state: Secondary | ICD-10-CM | POA: Diagnosis not present

## 2016-12-09 DIAGNOSIS — R82998 Other abnormal findings in urine: Secondary | ICD-10-CM | POA: Diagnosis not present

## 2017-04-23 ENCOUNTER — Other Ambulatory Visit: Payer: Self-pay | Admitting: Internal Medicine

## 2017-04-23 DIAGNOSIS — Z1231 Encounter for screening mammogram for malignant neoplasm of breast: Secondary | ICD-10-CM

## 2017-05-19 ENCOUNTER — Ambulatory Visit
Admission: RE | Admit: 2017-05-19 | Discharge: 2017-05-19 | Disposition: A | Discharge status: Home / Self Care | Payer: Medicare Other | Source: Ambulatory Visit | Attending: Internal Medicine | Admitting: Internal Medicine

## 2017-05-19 DIAGNOSIS — Z1231 Encounter for screening mammogram for malignant neoplasm of breast: Secondary | ICD-10-CM

## 2017-10-07 DIAGNOSIS — E113293 Type 2 diabetes mellitus with mild nonproliferative diabetic retinopathy without macular edema, bilateral: Secondary | ICD-10-CM | POA: Diagnosis not present

## 2017-10-07 DIAGNOSIS — H5203 Hypermetropia, bilateral: Secondary | ICD-10-CM | POA: Diagnosis not present

## 2017-10-07 DIAGNOSIS — H35371 Puckering of macula, right eye: Secondary | ICD-10-CM | POA: Diagnosis not present

## 2017-10-18 DIAGNOSIS — M859 Disorder of bone density and structure, unspecified: Secondary | ICD-10-CM | POA: Diagnosis not present

## 2017-10-18 DIAGNOSIS — I10 Essential (primary) hypertension: Secondary | ICD-10-CM | POA: Diagnosis not present

## 2017-10-18 DIAGNOSIS — E1139 Type 2 diabetes mellitus with other diabetic ophthalmic complication: Secondary | ICD-10-CM | POA: Diagnosis not present

## 2017-10-18 DIAGNOSIS — E7849 Other hyperlipidemia: Secondary | ICD-10-CM | POA: Diagnosis not present

## 2017-10-18 DIAGNOSIS — R82998 Other abnormal findings in urine: Secondary | ICD-10-CM | POA: Diagnosis not present

## 2017-10-25 DIAGNOSIS — M62838 Other muscle spasm: Secondary | ICD-10-CM | POA: Diagnosis not present

## 2017-10-25 DIAGNOSIS — M859 Disorder of bone density and structure, unspecified: Secondary | ICD-10-CM | POA: Diagnosis not present

## 2017-10-25 DIAGNOSIS — Z0000 Encounter for general adult medical examination without abnormal findings: Secondary | ICD-10-CM | POA: Diagnosis not present

## 2017-10-25 DIAGNOSIS — E668 Other obesity: Secondary | ICD-10-CM | POA: Diagnosis not present

## 2017-10-25 DIAGNOSIS — F3289 Other specified depressive episodes: Secondary | ICD-10-CM | POA: Diagnosis not present

## 2017-10-25 DIAGNOSIS — E7849 Other hyperlipidemia: Secondary | ICD-10-CM | POA: Diagnosis not present

## 2017-10-25 DIAGNOSIS — Z23 Encounter for immunization: Secondary | ICD-10-CM | POA: Diagnosis not present

## 2017-10-25 DIAGNOSIS — Z800 Family history of malignant neoplasm of digestive organs: Secondary | ICD-10-CM | POA: Diagnosis not present

## 2017-10-25 DIAGNOSIS — Z1389 Encounter for screening for other disorder: Secondary | ICD-10-CM | POA: Diagnosis not present

## 2017-10-25 DIAGNOSIS — I10 Essential (primary) hypertension: Secondary | ICD-10-CM | POA: Diagnosis not present

## 2017-10-25 DIAGNOSIS — E1139 Type 2 diabetes mellitus with other diabetic ophthalmic complication: Secondary | ICD-10-CM | POA: Diagnosis not present

## 2017-10-25 DIAGNOSIS — Z6832 Body mass index (BMI) 32.0-32.9, adult: Secondary | ICD-10-CM | POA: Diagnosis not present

## 2018-04-27 DIAGNOSIS — I10 Essential (primary) hypertension: Secondary | ICD-10-CM | POA: Diagnosis not present

## 2018-04-27 DIAGNOSIS — E1139 Type 2 diabetes mellitus with other diabetic ophthalmic complication: Secondary | ICD-10-CM | POA: Diagnosis not present

## 2018-04-27 DIAGNOSIS — F329 Major depressive disorder, single episode, unspecified: Secondary | ICD-10-CM | POA: Diagnosis not present

## 2018-04-27 DIAGNOSIS — E785 Hyperlipidemia, unspecified: Secondary | ICD-10-CM | POA: Diagnosis not present

## 2018-04-27 DIAGNOSIS — E669 Obesity, unspecified: Secondary | ICD-10-CM | POA: Diagnosis not present

## 2018-08-21 IMAGING — DX DG CHEST 2V
2 series · 2 of 2 positions shown · non-contrast
Comparison: May 19, 2005

CLINICAL DATA: Status post right shoulder replacement. Productive
cough.

EXAM:
CHEST  2 VIEW

[chest lat]
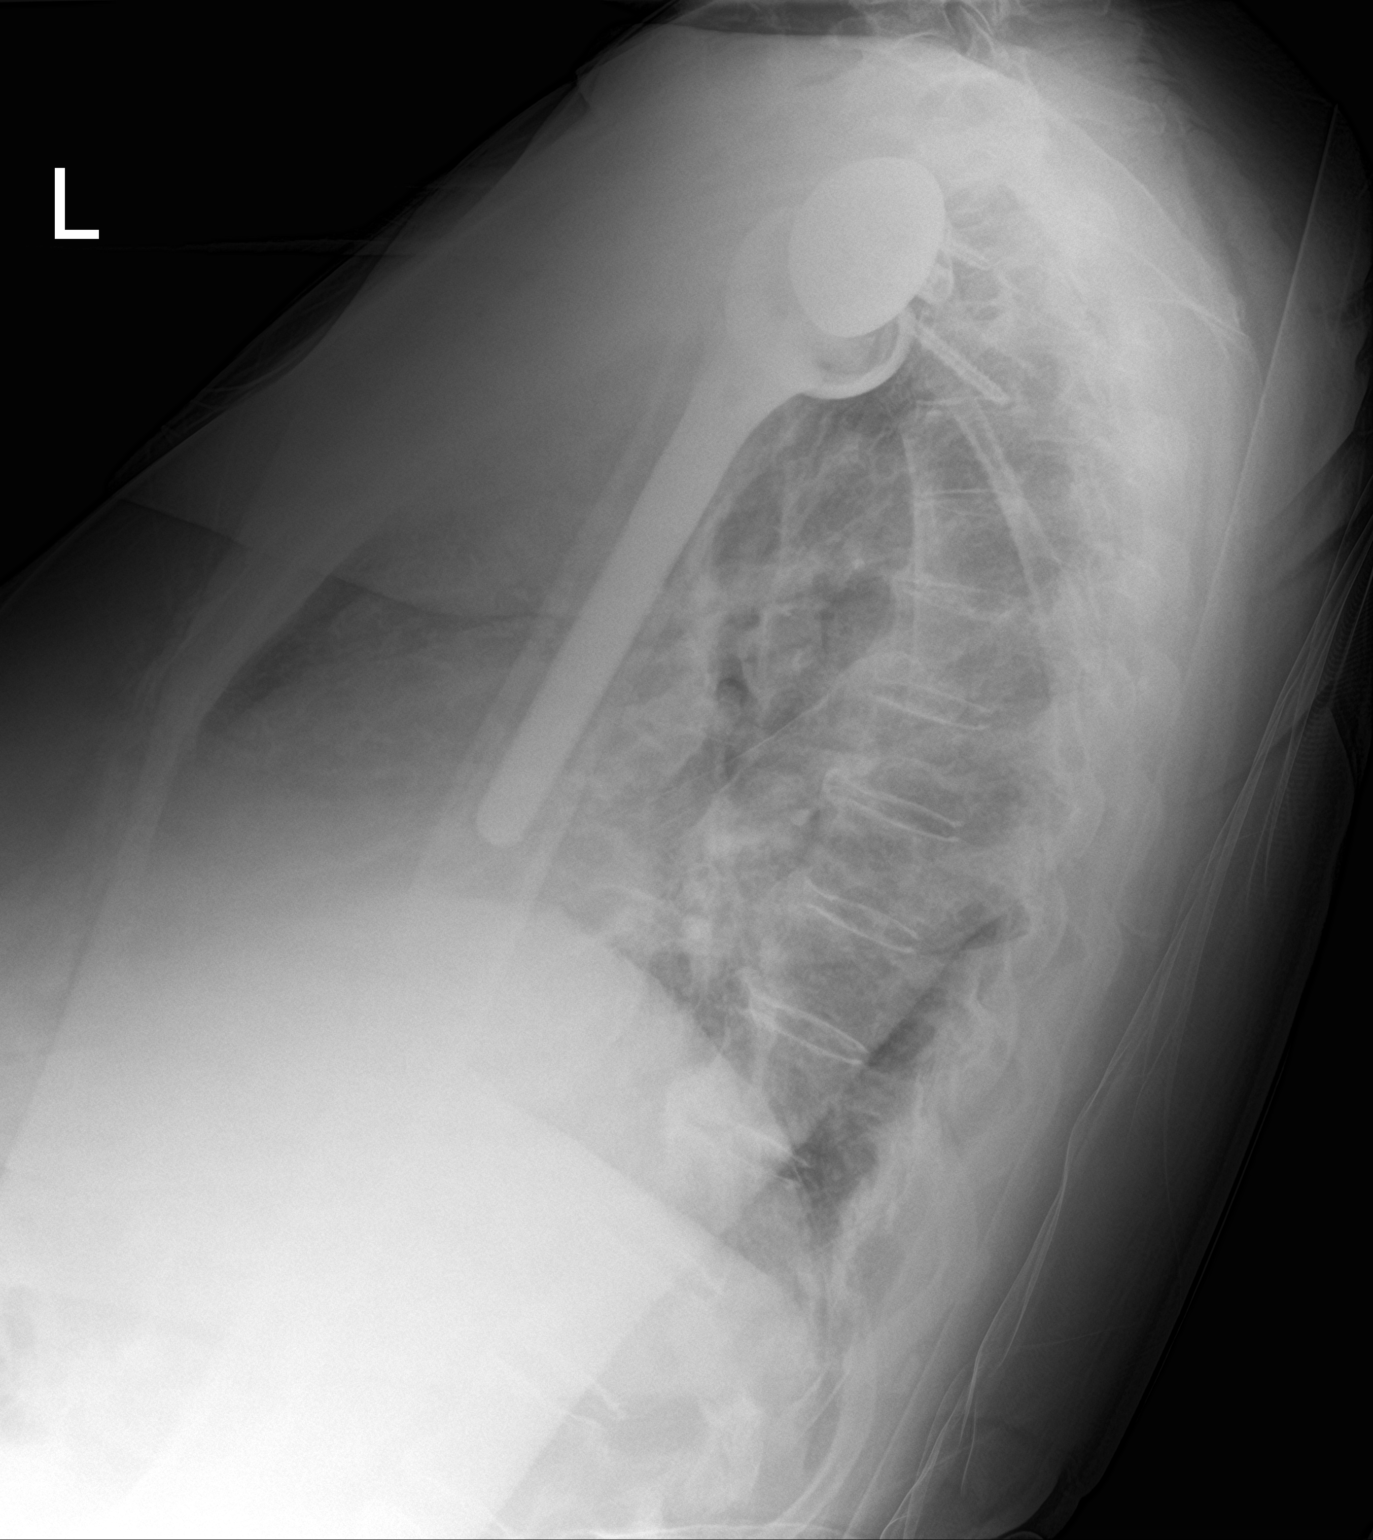

[chest ap]
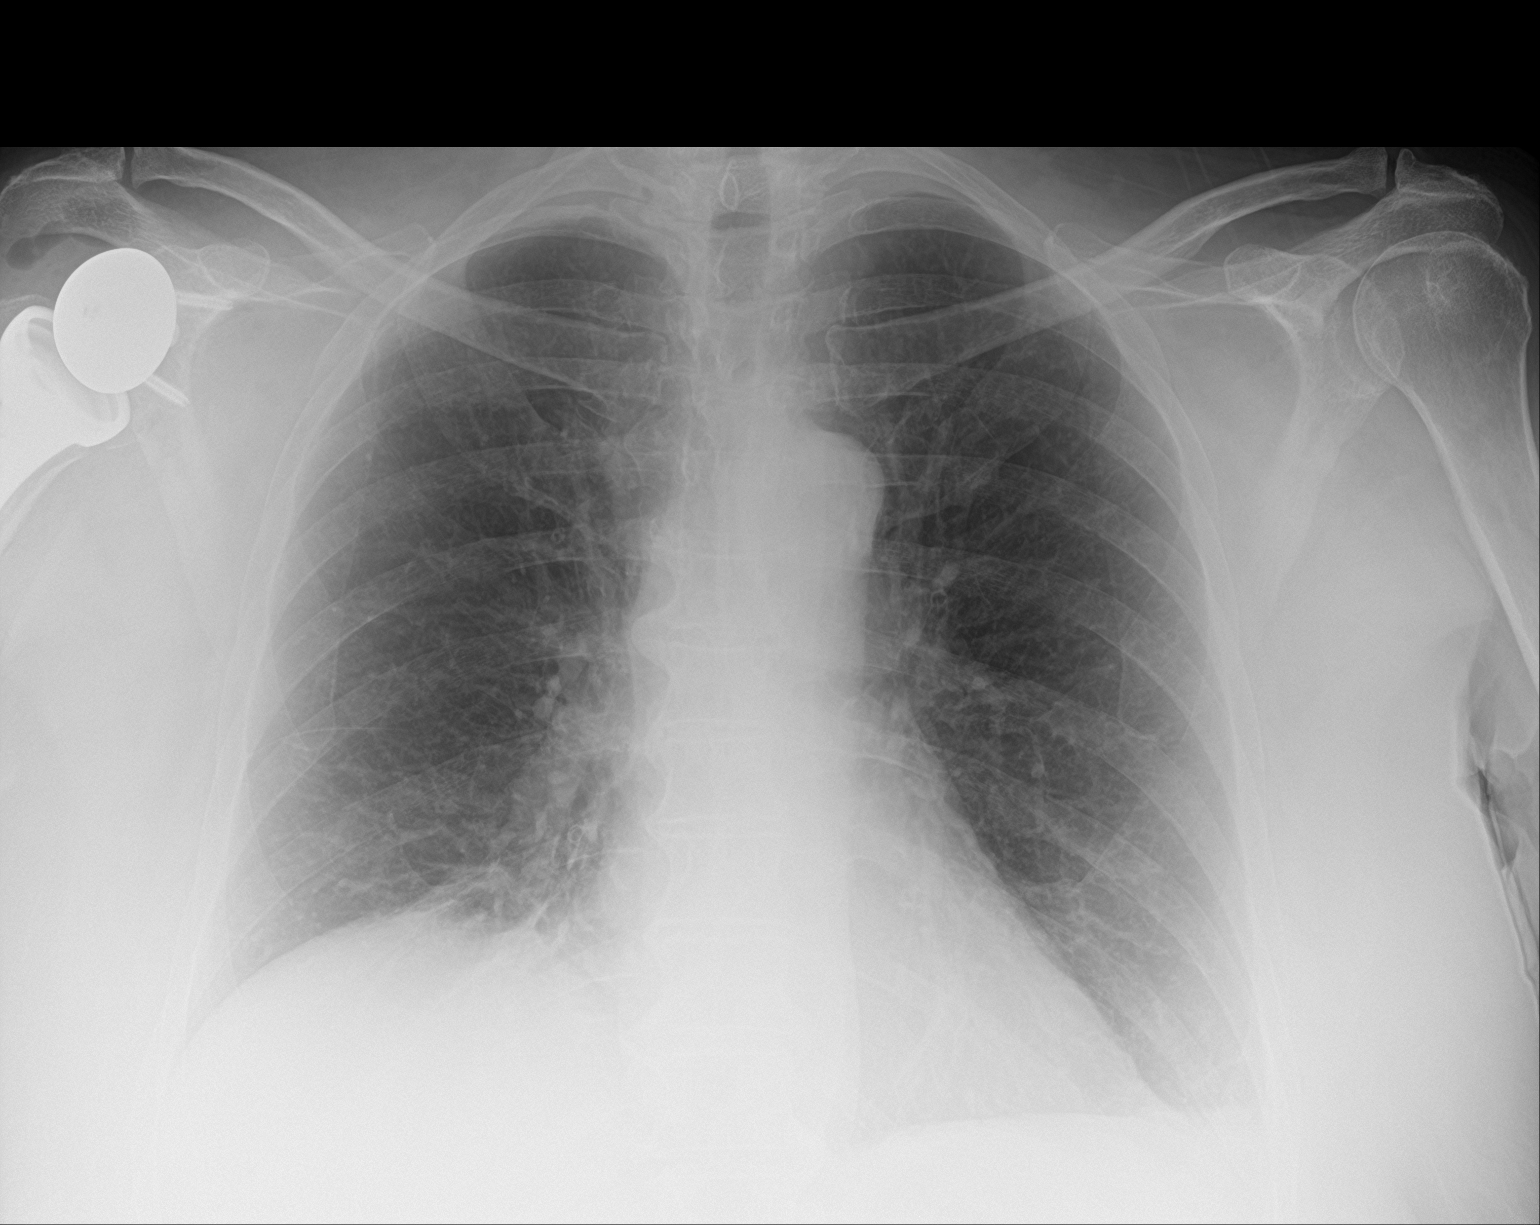

[2 of 2 positions shown; findings below may reference images not displayed]

FINDINGS: No pneumothorax. Infiltrate in medial right lung base. No other
interval changes or acute abnormalities.
IMPRESSION: Infiltrate in the medial right lung base. Recommend follow-up to
resolution.

## 2018-10-21 DIAGNOSIS — M8580 Other specified disorders of bone density and structure, unspecified site: Secondary | ICD-10-CM | POA: Diagnosis not present

## 2018-10-21 DIAGNOSIS — E1139 Type 2 diabetes mellitus with other diabetic ophthalmic complication: Secondary | ICD-10-CM | POA: Diagnosis not present

## 2018-10-21 DIAGNOSIS — E7849 Other hyperlipidemia: Secondary | ICD-10-CM | POA: Diagnosis not present

## 2018-10-28 DIAGNOSIS — E1139 Type 2 diabetes mellitus with other diabetic ophthalmic complication: Secondary | ICD-10-CM | POA: Diagnosis not present

## 2018-10-28 DIAGNOSIS — E669 Obesity, unspecified: Secondary | ICD-10-CM | POA: Diagnosis not present

## 2018-10-28 DIAGNOSIS — M8580 Other specified disorders of bone density and structure, unspecified site: Secondary | ICD-10-CM | POA: Diagnosis not present

## 2018-10-28 DIAGNOSIS — E785 Hyperlipidemia, unspecified: Secondary | ICD-10-CM | POA: Diagnosis not present

## 2018-10-28 DIAGNOSIS — F325 Major depressive disorder, single episode, in full remission: Secondary | ICD-10-CM | POA: Diagnosis not present

## 2018-10-28 DIAGNOSIS — Z800 Family history of malignant neoplasm of digestive organs: Secondary | ICD-10-CM | POA: Diagnosis not present

## 2018-10-28 DIAGNOSIS — I10 Essential (primary) hypertension: Secondary | ICD-10-CM | POA: Diagnosis not present

## 2018-10-28 DIAGNOSIS — E039 Hypothyroidism, unspecified: Secondary | ICD-10-CM | POA: Diagnosis not present

## 2018-10-28 DIAGNOSIS — Z0000 Encounter for general adult medical examination without abnormal findings: Secondary | ICD-10-CM | POA: Diagnosis not present

## 2018-10-28 DIAGNOSIS — Z1331 Encounter for screening for depression: Secondary | ICD-10-CM | POA: Diagnosis not present

## 2018-11-17 ENCOUNTER — Other Ambulatory Visit: Payer: Self-pay | Admitting: Internal Medicine

## 2018-11-17 DIAGNOSIS — Z1231 Encounter for screening mammogram for malignant neoplasm of breast: Secondary | ICD-10-CM

## 2019-01-06 ENCOUNTER — Ambulatory Visit: Payer: Medicare Other

## 2019-01-17 DIAGNOSIS — L0889 Other specified local infections of the skin and subcutaneous tissue: Secondary | ICD-10-CM | POA: Diagnosis not present

## 2019-01-17 DIAGNOSIS — E1139 Type 2 diabetes mellitus with other diabetic ophthalmic complication: Secondary | ICD-10-CM | POA: Diagnosis not present

## 2019-01-17 DIAGNOSIS — S61401A Unspecified open wound of right hand, initial encounter: Secondary | ICD-10-CM | POA: Diagnosis not present

## 2019-03-01 DIAGNOSIS — E039 Hypothyroidism, unspecified: Secondary | ICD-10-CM | POA: Diagnosis not present

## 2019-03-01 DIAGNOSIS — I10 Essential (primary) hypertension: Secondary | ICD-10-CM | POA: Diagnosis not present

## 2019-03-01 DIAGNOSIS — E1139 Type 2 diabetes mellitus with other diabetic ophthalmic complication: Secondary | ICD-10-CM | POA: Diagnosis not present

## 2019-03-01 DIAGNOSIS — F325 Major depressive disorder, single episode, in full remission: Secondary | ICD-10-CM | POA: Diagnosis not present

## 2019-03-01 DIAGNOSIS — E669 Obesity, unspecified: Secondary | ICD-10-CM | POA: Diagnosis not present

## 2019-03-01 DIAGNOSIS — E11319 Type 2 diabetes mellitus with unspecified diabetic retinopathy without macular edema: Secondary | ICD-10-CM | POA: Diagnosis not present

## 2019-03-01 DIAGNOSIS — E785 Hyperlipidemia, unspecified: Secondary | ICD-10-CM | POA: Diagnosis not present

## 2019-04-10 ENCOUNTER — Ambulatory Visit: Payer: Medicare Other

## 2019-06-12 ENCOUNTER — Ambulatory Visit: Payer: Medicare Other

## 2019-06-13 ENCOUNTER — Ambulatory Visit
Admission: RE | Admit: 2019-06-13 | Discharge: 2019-06-13 | Disposition: A | Discharge status: Home / Self Care | Payer: PRIVATE HEALTH INSURANCE | Source: Ambulatory Visit | Attending: Internal Medicine | Admitting: Internal Medicine

## 2019-06-13 ENCOUNTER — Other Ambulatory Visit: Payer: Self-pay

## 2019-06-13 DIAGNOSIS — Z1231 Encounter for screening mammogram for malignant neoplasm of breast: Secondary | ICD-10-CM

## 2019-07-06 DIAGNOSIS — E785 Hyperlipidemia, unspecified: Secondary | ICD-10-CM | POA: Diagnosis not present

## 2019-07-06 DIAGNOSIS — E1139 Type 2 diabetes mellitus with other diabetic ophthalmic complication: Secondary | ICD-10-CM | POA: Diagnosis not present

## 2019-07-06 DIAGNOSIS — I10 Essential (primary) hypertension: Secondary | ICD-10-CM | POA: Diagnosis not present

## 2019-07-06 DIAGNOSIS — F325 Major depressive disorder, single episode, in full remission: Secondary | ICD-10-CM | POA: Diagnosis not present

## 2019-07-06 DIAGNOSIS — M8580 Other specified disorders of bone density and structure, unspecified site: Secondary | ICD-10-CM | POA: Diagnosis not present

## 2019-07-06 DIAGNOSIS — E039 Hypothyroidism, unspecified: Secondary | ICD-10-CM | POA: Diagnosis not present

## 2019-07-06 DIAGNOSIS — E669 Obesity, unspecified: Secondary | ICD-10-CM | POA: Diagnosis not present

## 2019-10-04 DIAGNOSIS — H524 Presbyopia: Secondary | ICD-10-CM | POA: Diagnosis not present

## 2019-10-04 DIAGNOSIS — E113293 Type 2 diabetes mellitus with mild nonproliferative diabetic retinopathy without macular edema, bilateral: Secondary | ICD-10-CM | POA: Diagnosis not present

## 2019-10-04 DIAGNOSIS — H2513 Age-related nuclear cataract, bilateral: Secondary | ICD-10-CM | POA: Diagnosis not present

## 2019-10-04 DIAGNOSIS — H35371 Puckering of macula, right eye: Secondary | ICD-10-CM | POA: Diagnosis not present

## 2019-10-26 DIAGNOSIS — E039 Hypothyroidism, unspecified: Secondary | ICD-10-CM | POA: Diagnosis not present

## 2019-10-26 DIAGNOSIS — I10 Essential (primary) hypertension: Secondary | ICD-10-CM | POA: Diagnosis not present

## 2019-10-26 DIAGNOSIS — E785 Hyperlipidemia, unspecified: Secondary | ICD-10-CM | POA: Diagnosis not present

## 2019-10-26 DIAGNOSIS — E11319 Type 2 diabetes mellitus with unspecified diabetic retinopathy without macular edema: Secondary | ICD-10-CM | POA: Diagnosis not present

## 2019-10-26 DIAGNOSIS — M859 Disorder of bone density and structure, unspecified: Secondary | ICD-10-CM | POA: Diagnosis not present

## 2019-11-23 DIAGNOSIS — H25811 Combined forms of age-related cataract, right eye: Secondary | ICD-10-CM | POA: Diagnosis not present

## 2019-11-23 DIAGNOSIS — H2511 Age-related nuclear cataract, right eye: Secondary | ICD-10-CM | POA: Diagnosis not present

## 2019-11-30 DIAGNOSIS — H2512 Age-related nuclear cataract, left eye: Secondary | ICD-10-CM | POA: Diagnosis not present

## 2019-11-30 DIAGNOSIS — H25812 Combined forms of age-related cataract, left eye: Secondary | ICD-10-CM | POA: Diagnosis not present

## 2020-04-25 DIAGNOSIS — E11319 Type 2 diabetes mellitus with unspecified diabetic retinopathy without macular edema: Secondary | ICD-10-CM | POA: Diagnosis not present

## 2020-04-25 DIAGNOSIS — E785 Hyperlipidemia, unspecified: Secondary | ICD-10-CM | POA: Diagnosis not present

## 2020-04-25 DIAGNOSIS — M859 Disorder of bone density and structure, unspecified: Secondary | ICD-10-CM | POA: Diagnosis not present

## 2020-04-25 DIAGNOSIS — E039 Hypothyroidism, unspecified: Secondary | ICD-10-CM | POA: Diagnosis not present

## 2020-05-02 DIAGNOSIS — E11319 Type 2 diabetes mellitus with unspecified diabetic retinopathy without macular edema: Secondary | ICD-10-CM | POA: Diagnosis not present

## 2020-05-02 DIAGNOSIS — E669 Obesity, unspecified: Secondary | ICD-10-CM | POA: Diagnosis not present

## 2020-05-02 DIAGNOSIS — E039 Hypothyroidism, unspecified: Secondary | ICD-10-CM | POA: Diagnosis not present

## 2020-05-02 DIAGNOSIS — Z0000 Encounter for general adult medical examination without abnormal findings: Secondary | ICD-10-CM | POA: Diagnosis not present

## 2020-05-02 DIAGNOSIS — Z23 Encounter for immunization: Secondary | ICD-10-CM | POA: Diagnosis not present

## 2020-05-02 DIAGNOSIS — I10 Essential (primary) hypertension: Secondary | ICD-10-CM | POA: Diagnosis not present

## 2020-05-02 DIAGNOSIS — Z800 Family history of malignant neoplasm of digestive organs: Secondary | ICD-10-CM | POA: Diagnosis not present

## 2020-05-02 DIAGNOSIS — Z1331 Encounter for screening for depression: Secondary | ICD-10-CM | POA: Diagnosis not present

## 2020-05-02 DIAGNOSIS — H349 Unspecified retinal vascular occlusion: Secondary | ICD-10-CM | POA: Diagnosis not present

## 2020-05-02 DIAGNOSIS — E785 Hyperlipidemia, unspecified: Secondary | ICD-10-CM | POA: Diagnosis not present

## 2020-05-02 DIAGNOSIS — M8580 Other specified disorders of bone density and structure, unspecified site: Secondary | ICD-10-CM | POA: Diagnosis not present

## 2020-05-02 DIAGNOSIS — Z1339 Encounter for screening examination for other mental health and behavioral disorders: Secondary | ICD-10-CM | POA: Diagnosis not present

## 2020-05-07 ENCOUNTER — Other Ambulatory Visit: Payer: Self-pay | Admitting: Internal Medicine

## 2020-05-07 DIAGNOSIS — Z1231 Encounter for screening mammogram for malignant neoplasm of breast: Secondary | ICD-10-CM

## 2020-05-17 DIAGNOSIS — R82998 Other abnormal findings in urine: Secondary | ICD-10-CM | POA: Diagnosis not present

## 2020-05-17 DIAGNOSIS — M8589 Other specified disorders of bone density and structure, multiple sites: Secondary | ICD-10-CM | POA: Diagnosis not present

## 2020-05-17 DIAGNOSIS — I10 Essential (primary) hypertension: Secondary | ICD-10-CM | POA: Diagnosis not present

## 2020-05-17 DIAGNOSIS — E11319 Type 2 diabetes mellitus with unspecified diabetic retinopathy without macular edema: Secondary | ICD-10-CM | POA: Diagnosis not present

## 2020-06-27 ENCOUNTER — Ambulatory Visit: Payer: Medicare Other

## 2020-08-21 ENCOUNTER — Ambulatory Visit: Payer: Medicare Other

## 2020-08-22 ENCOUNTER — Ambulatory Visit: Payer: Medicare Other

## 2020-08-30 DIAGNOSIS — Z961 Presence of intraocular lens: Secondary | ICD-10-CM | POA: Diagnosis not present

## 2020-08-30 DIAGNOSIS — H52203 Unspecified astigmatism, bilateral: Secondary | ICD-10-CM | POA: Diagnosis not present

## 2020-08-30 DIAGNOSIS — H35371 Puckering of macula, right eye: Secondary | ICD-10-CM | POA: Diagnosis not present

## 2020-09-03 ENCOUNTER — Ambulatory Visit: Payer: Medicare Other

## 2020-10-22 ENCOUNTER — Ambulatory Visit: Payer: Medicare Other

## 2020-11-14 ENCOUNTER — Ambulatory Visit
Admission: RE | Admit: 2020-11-14 | Discharge: 2020-11-14 | Disposition: A | Discharge status: Home / Self Care | Payer: Medicare Other | Source: Ambulatory Visit | Attending: Internal Medicine | Admitting: Internal Medicine

## 2020-11-14 ENCOUNTER — Other Ambulatory Visit: Payer: Self-pay

## 2020-11-14 DIAGNOSIS — Z1231 Encounter for screening mammogram for malignant neoplasm of breast: Secondary | ICD-10-CM | POA: Diagnosis not present

## 2021-05-08 DIAGNOSIS — Z7982 Long term (current) use of aspirin: Secondary | ICD-10-CM | POA: Diagnosis not present

## 2021-05-08 DIAGNOSIS — E11319 Type 2 diabetes mellitus with unspecified diabetic retinopathy without macular edema: Secondary | ICD-10-CM | POA: Diagnosis not present

## 2021-05-08 DIAGNOSIS — Z79899 Other long term (current) drug therapy: Secondary | ICD-10-CM | POA: Diagnosis not present

## 2021-05-08 DIAGNOSIS — E039 Hypothyroidism, unspecified: Secondary | ICD-10-CM | POA: Diagnosis not present

## 2021-05-08 DIAGNOSIS — E785 Hyperlipidemia, unspecified: Secondary | ICD-10-CM | POA: Diagnosis not present

## 2021-05-08 DIAGNOSIS — M859 Disorder of bone density and structure, unspecified: Secondary | ICD-10-CM | POA: Diagnosis not present

## 2021-05-08 DIAGNOSIS — I10 Essential (primary) hypertension: Secondary | ICD-10-CM | POA: Diagnosis not present

## 2021-05-15 DIAGNOSIS — Z1331 Encounter for screening for depression: Secondary | ICD-10-CM | POA: Diagnosis not present

## 2021-05-15 DIAGNOSIS — E11319 Type 2 diabetes mellitus with unspecified diabetic retinopathy without macular edema: Secondary | ICD-10-CM | POA: Diagnosis not present

## 2021-05-15 DIAGNOSIS — Z0000 Encounter for general adult medical examination without abnormal findings: Secondary | ICD-10-CM | POA: Diagnosis not present

## 2021-05-15 DIAGNOSIS — I10 Essential (primary) hypertension: Secondary | ICD-10-CM | POA: Diagnosis not present

## 2021-05-15 DIAGNOSIS — Z800 Family history of malignant neoplasm of digestive organs: Secondary | ICD-10-CM | POA: Diagnosis not present

## 2021-05-15 DIAGNOSIS — F325 Major depressive disorder, single episode, in full remission: Secondary | ICD-10-CM | POA: Diagnosis not present

## 2021-05-15 DIAGNOSIS — M8580 Other specified disorders of bone density and structure, unspecified site: Secondary | ICD-10-CM | POA: Diagnosis not present

## 2021-05-15 DIAGNOSIS — R82998 Other abnormal findings in urine: Secondary | ICD-10-CM | POA: Diagnosis not present

## 2021-05-15 DIAGNOSIS — E039 Hypothyroidism, unspecified: Secondary | ICD-10-CM | POA: Diagnosis not present

## 2021-05-15 DIAGNOSIS — E785 Hyperlipidemia, unspecified: Secondary | ICD-10-CM | POA: Diagnosis not present

## 2021-05-15 DIAGNOSIS — H8112 Benign paroxysmal vertigo, left ear: Secondary | ICD-10-CM | POA: Diagnosis not present

## 2021-05-15 DIAGNOSIS — E669 Obesity, unspecified: Secondary | ICD-10-CM | POA: Diagnosis not present

## 2021-05-15 DIAGNOSIS — Z1339 Encounter for screening examination for other mental health and behavioral disorders: Secondary | ICD-10-CM | POA: Diagnosis not present

## 2021-05-15 DIAGNOSIS — Z23 Encounter for immunization: Secondary | ICD-10-CM | POA: Diagnosis not present

## 2021-05-15 DIAGNOSIS — J302 Other seasonal allergic rhinitis: Secondary | ICD-10-CM | POA: Diagnosis not present

## 2021-09-09 DIAGNOSIS — Z961 Presence of intraocular lens: Secondary | ICD-10-CM | POA: Diagnosis not present

## 2021-09-09 DIAGNOSIS — H524 Presbyopia: Secondary | ICD-10-CM | POA: Diagnosis not present

## 2021-09-09 DIAGNOSIS — E113293 Type 2 diabetes mellitus with mild nonproliferative diabetic retinopathy without macular edema, bilateral: Secondary | ICD-10-CM | POA: Diagnosis not present

## 2021-09-09 DIAGNOSIS — H35371 Puckering of macula, right eye: Secondary | ICD-10-CM | POA: Diagnosis not present

## 2021-11-18 DIAGNOSIS — Z23 Encounter for immunization: Secondary | ICD-10-CM | POA: Diagnosis not present

## 2021-11-18 DIAGNOSIS — E785 Hyperlipidemia, unspecified: Secondary | ICD-10-CM | POA: Diagnosis not present

## 2021-11-18 DIAGNOSIS — F325 Major depressive disorder, single episode, in full remission: Secondary | ICD-10-CM | POA: Diagnosis not present

## 2021-11-18 DIAGNOSIS — H8112 Benign paroxysmal vertigo, left ear: Secondary | ICD-10-CM | POA: Diagnosis not present

## 2021-11-18 DIAGNOSIS — E669 Obesity, unspecified: Secondary | ICD-10-CM | POA: Diagnosis not present

## 2021-11-18 DIAGNOSIS — M8580 Other specified disorders of bone density and structure, unspecified site: Secondary | ICD-10-CM | POA: Diagnosis not present

## 2021-11-18 DIAGNOSIS — I10 Essential (primary) hypertension: Secondary | ICD-10-CM | POA: Diagnosis not present

## 2021-11-18 DIAGNOSIS — E11319 Type 2 diabetes mellitus with unspecified diabetic retinopathy without macular edema: Secondary | ICD-10-CM | POA: Diagnosis not present

## 2021-11-18 DIAGNOSIS — Z800 Family history of malignant neoplasm of digestive organs: Secondary | ICD-10-CM | POA: Diagnosis not present

## 2021-11-20 ENCOUNTER — Other Ambulatory Visit: Payer: Self-pay | Admitting: Internal Medicine

## 2021-11-20 DIAGNOSIS — Z1231 Encounter for screening mammogram for malignant neoplasm of breast: Secondary | ICD-10-CM

## 2021-12-08 ENCOUNTER — Ambulatory Visit
Admission: RE | Admit: 2021-12-08 | Discharge: 2021-12-08 | Disposition: A | Discharge status: Home / Self Care | Payer: Medicare Other | Source: Ambulatory Visit | Attending: Internal Medicine | Admitting: Internal Medicine

## 2021-12-08 DIAGNOSIS — Z1231 Encounter for screening mammogram for malignant neoplasm of breast: Secondary | ICD-10-CM | POA: Diagnosis not present

## 2022-02-06 DIAGNOSIS — K5730 Diverticulosis of large intestine without perforation or abscess without bleeding: Secondary | ICD-10-CM | POA: Diagnosis not present

## 2022-02-06 DIAGNOSIS — Z09 Encounter for follow-up examination after completed treatment for conditions other than malignant neoplasm: Secondary | ICD-10-CM | POA: Diagnosis not present

## 2022-02-06 DIAGNOSIS — D123 Benign neoplasm of transverse colon: Secondary | ICD-10-CM | POA: Diagnosis not present

## 2022-02-06 DIAGNOSIS — D120 Benign neoplasm of cecum: Secondary | ICD-10-CM | POA: Diagnosis not present

## 2022-02-06 DIAGNOSIS — Z86010 Personal history of colonic polyps: Secondary | ICD-10-CM | POA: Diagnosis not present

## 2022-02-11 DIAGNOSIS — D120 Benign neoplasm of cecum: Secondary | ICD-10-CM | POA: Diagnosis not present

## 2022-02-11 DIAGNOSIS — D123 Benign neoplasm of transverse colon: Secondary | ICD-10-CM | POA: Diagnosis not present

## 2022-05-14 DIAGNOSIS — E11319 Type 2 diabetes mellitus with unspecified diabetic retinopathy without macular edema: Secondary | ICD-10-CM | POA: Diagnosis not present

## 2022-05-14 DIAGNOSIS — I10 Essential (primary) hypertension: Secondary | ICD-10-CM | POA: Diagnosis not present

## 2022-05-14 DIAGNOSIS — E785 Hyperlipidemia, unspecified: Secondary | ICD-10-CM | POA: Diagnosis not present

## 2022-05-14 DIAGNOSIS — E039 Hypothyroidism, unspecified: Secondary | ICD-10-CM | POA: Diagnosis not present

## 2022-05-14 DIAGNOSIS — R7989 Other specified abnormal findings of blood chemistry: Secondary | ICD-10-CM | POA: Diagnosis not present

## 2022-05-21 DIAGNOSIS — J302 Other seasonal allergic rhinitis: Secondary | ICD-10-CM | POA: Diagnosis not present

## 2022-05-21 DIAGNOSIS — E039 Hypothyroidism, unspecified: Secondary | ICD-10-CM | POA: Diagnosis not present

## 2022-05-21 DIAGNOSIS — I10 Essential (primary) hypertension: Secondary | ICD-10-CM | POA: Diagnosis not present

## 2022-05-21 DIAGNOSIS — E669 Obesity, unspecified: Secondary | ICD-10-CM | POA: Diagnosis not present

## 2022-05-21 DIAGNOSIS — Z800 Family history of malignant neoplasm of digestive organs: Secondary | ICD-10-CM | POA: Diagnosis not present

## 2022-05-21 DIAGNOSIS — M8580 Other specified disorders of bone density and structure, unspecified site: Secondary | ICD-10-CM | POA: Diagnosis not present

## 2022-05-21 DIAGNOSIS — E11319 Type 2 diabetes mellitus with unspecified diabetic retinopathy without macular edema: Secondary | ICD-10-CM | POA: Diagnosis not present

## 2022-05-21 DIAGNOSIS — E785 Hyperlipidemia, unspecified: Secondary | ICD-10-CM | POA: Diagnosis not present

## 2022-05-21 DIAGNOSIS — Z0000 Encounter for general adult medical examination without abnormal findings: Secondary | ICD-10-CM | POA: Diagnosis not present

## 2022-05-21 DIAGNOSIS — R82998 Other abnormal findings in urine: Secondary | ICD-10-CM | POA: Diagnosis not present

## 2022-05-21 DIAGNOSIS — H8112 Benign paroxysmal vertigo, left ear: Secondary | ICD-10-CM | POA: Diagnosis not present

## 2022-05-21 DIAGNOSIS — Z1339 Encounter for screening examination for other mental health and behavioral disorders: Secondary | ICD-10-CM | POA: Diagnosis not present

## 2022-05-21 DIAGNOSIS — Z1331 Encounter for screening for depression: Secondary | ICD-10-CM | POA: Diagnosis not present

## 2022-09-15 DIAGNOSIS — E113293 Type 2 diabetes mellitus with mild nonproliferative diabetic retinopathy without macular edema, bilateral: Secondary | ICD-10-CM | POA: Diagnosis not present

## 2022-09-15 DIAGNOSIS — H5203 Hypermetropia, bilateral: Secondary | ICD-10-CM | POA: Diagnosis not present

## 2022-09-15 DIAGNOSIS — Z961 Presence of intraocular lens: Secondary | ICD-10-CM | POA: Diagnosis not present

## 2022-11-09 ENCOUNTER — Other Ambulatory Visit: Payer: Self-pay | Admitting: Internal Medicine

## 2022-11-09 DIAGNOSIS — Z1231 Encounter for screening mammogram for malignant neoplasm of breast: Secondary | ICD-10-CM

## 2022-11-23 DIAGNOSIS — M8580 Other specified disorders of bone density and structure, unspecified site: Secondary | ICD-10-CM | POA: Diagnosis not present

## 2022-11-23 DIAGNOSIS — Z800 Family history of malignant neoplasm of digestive organs: Secondary | ICD-10-CM | POA: Diagnosis not present

## 2022-11-23 DIAGNOSIS — E669 Obesity, unspecified: Secondary | ICD-10-CM | POA: Diagnosis not present

## 2022-11-23 DIAGNOSIS — E11319 Type 2 diabetes mellitus with unspecified diabetic retinopathy without macular edema: Secondary | ICD-10-CM | POA: Diagnosis not present

## 2022-11-23 DIAGNOSIS — E039 Hypothyroidism, unspecified: Secondary | ICD-10-CM | POA: Diagnosis not present

## 2022-11-23 DIAGNOSIS — F325 Major depressive disorder, single episode, in full remission: Secondary | ICD-10-CM | POA: Diagnosis not present

## 2022-11-23 DIAGNOSIS — H8112 Benign paroxysmal vertigo, left ear: Secondary | ICD-10-CM | POA: Diagnosis not present

## 2022-11-23 DIAGNOSIS — I10 Essential (primary) hypertension: Secondary | ICD-10-CM | POA: Diagnosis not present

## 2022-11-23 DIAGNOSIS — Z23 Encounter for immunization: Secondary | ICD-10-CM | POA: Diagnosis not present

## 2022-11-23 DIAGNOSIS — J302 Other seasonal allergic rhinitis: Secondary | ICD-10-CM | POA: Diagnosis not present

## 2022-11-23 DIAGNOSIS — E785 Hyperlipidemia, unspecified: Secondary | ICD-10-CM | POA: Diagnosis not present

## 2022-12-11 ENCOUNTER — Ambulatory Visit
Admission: RE | Admit: 2022-12-11 | Discharge: 2022-12-11 | Disposition: A | Discharge status: Home / Self Care | Payer: Medicare Other | Source: Ambulatory Visit | Attending: Internal Medicine | Admitting: Internal Medicine

## 2022-12-11 DIAGNOSIS — Z1231 Encounter for screening mammogram for malignant neoplasm of breast: Secondary | ICD-10-CM

## 2023-06-01 DIAGNOSIS — M8580 Other specified disorders of bone density and structure, unspecified site: Secondary | ICD-10-CM | POA: Diagnosis not present

## 2023-06-01 DIAGNOSIS — E785 Hyperlipidemia, unspecified: Secondary | ICD-10-CM | POA: Diagnosis not present

## 2023-06-01 DIAGNOSIS — E11319 Type 2 diabetes mellitus with unspecified diabetic retinopathy without macular edema: Secondary | ICD-10-CM | POA: Diagnosis not present

## 2023-06-01 DIAGNOSIS — I10 Essential (primary) hypertension: Secondary | ICD-10-CM | POA: Diagnosis not present

## 2023-06-01 DIAGNOSIS — Z780 Asymptomatic menopausal state: Secondary | ICD-10-CM | POA: Diagnosis not present

## 2023-06-01 DIAGNOSIS — E039 Hypothyroidism, unspecified: Secondary | ICD-10-CM | POA: Diagnosis not present

## 2023-06-01 DIAGNOSIS — M859 Disorder of bone density and structure, unspecified: Secondary | ICD-10-CM | POA: Diagnosis not present

## 2023-06-08 DIAGNOSIS — H8112 Benign paroxysmal vertigo, left ear: Secondary | ICD-10-CM | POA: Diagnosis not present

## 2023-06-08 DIAGNOSIS — Z0000 Encounter for general adult medical examination without abnormal findings: Secondary | ICD-10-CM | POA: Diagnosis not present

## 2023-06-08 DIAGNOSIS — E11319 Type 2 diabetes mellitus with unspecified diabetic retinopathy without macular edema: Secondary | ICD-10-CM | POA: Diagnosis not present

## 2023-06-08 DIAGNOSIS — Z1331 Encounter for screening for depression: Secondary | ICD-10-CM | POA: Diagnosis not present

## 2023-06-08 DIAGNOSIS — F325 Major depressive disorder, single episode, in full remission: Secondary | ICD-10-CM | POA: Diagnosis not present

## 2023-06-08 DIAGNOSIS — R82998 Other abnormal findings in urine: Secondary | ICD-10-CM | POA: Diagnosis not present

## 2023-06-08 DIAGNOSIS — Z1339 Encounter for screening examination for other mental health and behavioral disorders: Secondary | ICD-10-CM | POA: Diagnosis not present

## 2023-06-08 DIAGNOSIS — I10 Essential (primary) hypertension: Secondary | ICD-10-CM | POA: Diagnosis not present

## 2023-06-08 DIAGNOSIS — E669 Obesity, unspecified: Secondary | ICD-10-CM | POA: Diagnosis not present

## 2023-06-08 DIAGNOSIS — J302 Other seasonal allergic rhinitis: Secondary | ICD-10-CM | POA: Diagnosis not present

## 2023-06-08 DIAGNOSIS — M8580 Other specified disorders of bone density and structure, unspecified site: Secondary | ICD-10-CM | POA: Diagnosis not present

## 2023-06-08 DIAGNOSIS — Z800 Family history of malignant neoplasm of digestive organs: Secondary | ICD-10-CM | POA: Diagnosis not present

## 2023-06-08 DIAGNOSIS — E785 Hyperlipidemia, unspecified: Secondary | ICD-10-CM | POA: Diagnosis not present

## 2023-09-17 DIAGNOSIS — H524 Presbyopia: Secondary | ICD-10-CM | POA: Diagnosis not present

## 2023-09-17 DIAGNOSIS — Z961 Presence of intraocular lens: Secondary | ICD-10-CM | POA: Diagnosis not present

## 2023-09-17 DIAGNOSIS — E113293 Type 2 diabetes mellitus with mild nonproliferative diabetic retinopathy without macular edema, bilateral: Secondary | ICD-10-CM | POA: Diagnosis not present

## 2023-11-19 ENCOUNTER — Other Ambulatory Visit: Payer: Self-pay | Admitting: Internal Medicine

## 2023-11-19 DIAGNOSIS — Z1231 Encounter for screening mammogram for malignant neoplasm of breast: Secondary | ICD-10-CM

## 2023-12-13 ENCOUNTER — Ambulatory Visit
Admission: RE | Admit: 2023-12-13 | Discharge: 2023-12-13 | Disposition: A | Discharge status: Home / Self Care | Source: Ambulatory Visit | Attending: Internal Medicine | Admitting: Internal Medicine

## 2023-12-13 DIAGNOSIS — Z1231 Encounter for screening mammogram for malignant neoplasm of breast: Secondary | ICD-10-CM
